# Patient Record
Sex: Male | Born: 1980 | Race: White | Hispanic: No | Marital: Married | Smoking: Never smoker
Health system: Southern US, Community
[De-identification: ages and names within clinical notes are randomized; demographics above are authoritative.]

## PROBLEM LIST (undated history)

## (undated) DIAGNOSIS — E119 Type 2 diabetes mellitus without complications: Secondary | ICD-10-CM

## (undated) DIAGNOSIS — I10 Essential (primary) hypertension: Secondary | ICD-10-CM

## (undated) DIAGNOSIS — N200 Calculus of kidney: Secondary | ICD-10-CM

## (undated) DIAGNOSIS — U071 COVID-19: Secondary | ICD-10-CM

## (undated) DIAGNOSIS — N419 Inflammatory disease of prostate, unspecified: Secondary | ICD-10-CM

## (undated) DIAGNOSIS — E785 Hyperlipidemia, unspecified: Secondary | ICD-10-CM

## (undated) DIAGNOSIS — T7840XA Allergy, unspecified, initial encounter: Secondary | ICD-10-CM

## (undated) HISTORY — DX: Essential (primary) hypertension: I10

## (undated) HISTORY — DX: COVID-19: U07.1

## (undated) HISTORY — DX: Calculus of kidney: N20.0

## (undated) HISTORY — DX: Allergy, unspecified, initial encounter: T78.40XA

## (undated) HISTORY — PX: EYE SURGERY: SHX253

## (undated) HISTORY — PX: FRACTURE SURGERY: SHX138

## (undated) HISTORY — DX: Inflammatory disease of prostate, unspecified: N41.9

## (undated) HISTORY — PX: LASIK: SHX215

## (undated) HISTORY — PX: FEMUR FRACTURE SURGERY: SHX633

---

## 2008-05-12 DIAGNOSIS — N419 Inflammatory disease of prostate, unspecified: Secondary | ICD-10-CM

## 2008-05-12 HISTORY — DX: Inflammatory disease of prostate, unspecified: N41.9

## 2010-12-04 ENCOUNTER — Encounter: Payer: Self-pay | Admitting: *Deleted

## 2010-12-04 ENCOUNTER — Emergency Department (HOSPITAL_BASED_OUTPATIENT_CLINIC_OR_DEPARTMENT_OTHER)
Admission: EM | Admit: 2010-12-04 | Discharge: 2010-12-04 | Disposition: A | Discharge status: Home / Self Care | Payer: Managed Care, Other (non HMO) | Attending: Emergency Medicine | Admitting: Emergency Medicine

## 2010-12-04 ENCOUNTER — Emergency Department (INDEPENDENT_AMBULATORY_CARE_PROVIDER_SITE_OTHER): Payer: Managed Care, Other (non HMO)

## 2010-12-04 DIAGNOSIS — R5381 Other malaise: Secondary | ICD-10-CM

## 2010-12-04 DIAGNOSIS — IMO0001 Reserved for inherently not codable concepts without codable children: Secondary | ICD-10-CM | POA: Insufficient documentation

## 2010-12-04 DIAGNOSIS — E785 Hyperlipidemia, unspecified: Secondary | ICD-10-CM | POA: Insufficient documentation

## 2010-12-04 DIAGNOSIS — B2790 Infectious mononucleosis, unspecified without complication: Secondary | ICD-10-CM | POA: Insufficient documentation

## 2010-12-04 DIAGNOSIS — R7989 Other specified abnormal findings of blood chemistry: Secondary | ICD-10-CM

## 2010-12-04 DIAGNOSIS — R509 Fever, unspecified: Secondary | ICD-10-CM

## 2010-12-04 DIAGNOSIS — R5383 Other fatigue: Secondary | ICD-10-CM

## 2010-12-04 HISTORY — DX: Hyperlipidemia, unspecified: E78.5

## 2010-12-04 LAB — URINALYSIS, ROUTINE W REFLEX MICROSCOPIC
Hgb urine dipstick: NEGATIVE
Leukocytes, UA: NEGATIVE
Nitrite: NEGATIVE
Protein, ur: 30 mg/dL — AB
Specific Gravity, Urine: 1.037 — ABNORMAL HIGH (ref 1.005–1.030)
Urobilinogen, UA: 1 mg/dL (ref 0.0–1.0)

## 2010-12-04 LAB — CBC
MCV: 77.3 fL — ABNORMAL LOW (ref 78.0–100.0)
Platelets: 154 10*3/uL (ref 150–400)
RBC: 5.1 MIL/uL (ref 4.22–5.81)
RDW: 15.8 % — ABNORMAL HIGH (ref 11.5–15.5)
WBC: 6.1 10*3/uL (ref 4.0–10.5)

## 2010-12-04 LAB — DIFFERENTIAL
Basophils Absolute: 0 10*3/uL (ref 0.0–0.1)
Eosinophils Relative: 1 % (ref 0–5)
Lymphocytes Relative: 74 % — ABNORMAL HIGH (ref 12–46)
Lymphs Abs: 4.5 10*3/uL — ABNORMAL HIGH (ref 0.7–4.0)
Monocytes Relative: 13 % — ABNORMAL HIGH (ref 3–12)
Neutrophils Relative %: 12 % — ABNORMAL LOW (ref 43–77)

## 2010-12-04 LAB — COMPREHENSIVE METABOLIC PANEL
ALT: 130 U/L — ABNORMAL HIGH (ref 0–53)
Albumin: 3.4 g/dL — ABNORMAL LOW (ref 3.5–5.2)
Alkaline Phosphatase: 139 U/L — ABNORMAL HIGH (ref 39–117)
Calcium: 9.1 mg/dL (ref 8.4–10.5)
GFR calc Af Amer: >60 mL/min (ref >60)
Glucose, Bld: 127 mg/dL — ABNORMAL HIGH (ref 70–99)
Potassium: 4 mEq/L (ref 3.5–5.1)
Sodium: 137 mEq/L (ref 135–145)
Total Protein: 7.7 g/dL (ref 6.0–8.3)

## 2010-12-04 LAB — RAPID STREP SCREEN (MED CTR MEBANE ONLY): Streptococcus, Group A Screen (Direct): NEGATIVE

## 2010-12-04 LAB — MONONUCLEOSIS SCREEN: Mono Screen: POSITIVE — AB

## 2010-12-04 MED ORDER — PREDNISONE 10 MG PO TABS: 20.0000 mg | ORAL_TABLET | Freq: Every day | ORAL | Status: AC

## 2010-12-04 NOTE — ED Notes (Signed)
Pt in with c/o sore throat and fever x 1.5 weeks. Reports recent treatment from [facility] with possible dx of [facility] Spotted Fever. Pt recently told no RMSF.

## 2010-12-04 NOTE — ED Notes (Signed)
Pt in with c/o generalized aches, chills, decreased appetite and po intake x 1.5 weeks. Recently seen for possible rocky [facility] spotted fever but testing came back neg. Pt reports to ED under advice from PCP for advanced blood work. Pt afebrile at present time but reports fever x 1 week and treating fever with acetaminophen. Pt was told by PCP office that he may have viral syndrome. Reports slight improvement since taking antibiotics, but still with generalized aches, chills and cold s/sx. Airway patent and intact.

## 2010-12-04 NOTE — ED Provider Notes (Signed)
History     Chief Complaint  Patient presents with  . Fever   HPI Comments: Pt states that he was seen over a week ago and treat for possible rmsf which resulted negative since,pt states that he has 2 days left of the doxy:pt states that he also had a low plt count with his labs:pt states that he has continued to have fever with the antibiotics  Patient is a 30 y.o. male presenting with fever. The history is provided by the patient. No language interpreter was used.  Fever Primary symptoms of the febrile illness include fever and myalgias. Primary symptoms do not include fatigue, visual change, headaches, cough, shortness of breath, abdominal pain, nausea, diarrhea, dysuria or rash. The current episode started more than 1 week ago. This is a new problem. The problem has not changed since onset. The fever began more than 1 week ago. The maximum temperature recorded prior to his arrival was 103 to 104 F. The temperature was taken by an oral thermometer.    Past Medical History  Diagnosis Date  . Hyperlipemia     Past Surgical History  Procedure Date  . Femur fracture surgery   . Lasix      History reviewed. No pertinent family history.  History  Substance Use Topics  . Smoking status: Never Smoker   . Smokeless tobacco: Never Used  . Alcohol Use: Yes      Review of Systems  Constitutional: Positive for fever. Negative for fatigue.  HENT: Negative for neck pain and neck stiffness.        Sorethroat  Respiratory: Negative for cough and shortness of breath.   Gastrointestinal: Negative for nausea, abdominal pain and diarrhea.  Genitourinary: Negative for dysuria.  Musculoskeletal: Positive for myalgias.  Skin: Negative for rash.  Neurological: Negative for headaches.  All other systems reviewed and are negative.    Physical Exam  BP 165/96  Pulse 80  Temp(Src) 98.2 F (36.8 C) (Oral)  Resp 20  Wt 239 lb (108.41 kg)  SpO2 100%  Physical Exam  Constitutional: He  is oriented to person, place, and time. He appears well-developed and well-nourished.  HENT:  Head: Normocephalic and atraumatic.  Right Ear: Tympanic membrane normal.  Left Ear: Tympanic membrane normal.  Mouth/Throat: Oropharyngeal exudate and posterior oropharyngeal edema present.  Eyes: Pupils are equal, round, and reactive to light.  Neck: Normal range of motion. Neck supple.  Cardiovascular: Normal rate.   Pulmonary/Chest: Effort normal and breath sounds normal.  Abdominal: Soft. Bowel sounds are normal.  Musculoskeletal: Normal range of motion.  Neurological: He is oriented to person, place, and time.  Skin: Skin is warm and dry.  Psychiatric: He has a normal mood and affect. His behavior is normal.    ED Course  Procedures  Labs Reviewed  CBC - Abnormal; Notable for the following:    MCV 77.3 (*)    RDW 15.8 (*)    All other components within normal limits  DIFFERENTIAL - Abnormal; Notable for the following:    Neutrophils Relative 12 (*)    Lymphocytes Relative 74 (*)    Monocytes Relative 13 (*)    Neutro Abs 0.7 (*)    Lymphs Abs 4.5 (*)    All other components within normal limits  URINALYSIS, ROUTINE W REFLEX MICROSCOPIC - Abnormal; Notable for the following:    Color, Urine AMBER (*) BIOCHEMICALS MAY BE AFFECTED BY COLOR   Specific Gravity, Urine 1.037 (*)    Bilirubin Urine SMALL (*)  Protein, ur 30 (*)    All other components within normal limits  COMPREHENSIVE METABOLIC PANEL - Abnormal; Notable for the following:    Glucose, Bld 127 (*)    Albumin 3.4 (*)    AST 63 (*)    ALT 130 (*)    Alkaline Phosphatase 139 (*)    All other components within normal limits  MONONUCLEOSIS SCREEN - Abnormal; Notable for the following:    Mono Screen POSITIVE (*)    All other components within normal limits  RAPID STREP SCREEN  URINE MICROSCOPIC-ADD ON  PATHOLOGIST SMEAR REVIEW   MDM Pt positive for mono:will give steriods to QM:VHQIONGEX with pt that lfts need  to be redrawn:pt educated on contact sports      Teressa Lower, NP 12/04/10 1757

## 2010-12-05 ENCOUNTER — Ambulatory Visit (INDEPENDENT_AMBULATORY_CARE_PROVIDER_SITE_OTHER): Payer: Managed Care, Other (non HMO) | Admitting: Internal Medicine

## 2010-12-05 ENCOUNTER — Encounter: Payer: Self-pay | Admitting: Internal Medicine

## 2010-12-05 DIAGNOSIS — Z0000 Encounter for general adult medical examination without abnormal findings: Secondary | ICD-10-CM

## 2010-12-05 DIAGNOSIS — R7309 Other abnormal glucose: Secondary | ICD-10-CM

## 2010-12-05 DIAGNOSIS — B178 Other specified acute viral hepatitis: Secondary | ICD-10-CM

## 2010-12-05 DIAGNOSIS — B199 Unspecified viral hepatitis without hepatic coma: Secondary | ICD-10-CM

## 2010-12-05 DIAGNOSIS — B2790 Infectious mononucleosis, unspecified without complication: Secondary | ICD-10-CM

## 2010-12-05 DIAGNOSIS — R739 Hyperglycemia, unspecified: Secondary | ICD-10-CM

## 2010-12-05 LAB — PATHOLOGIST SMEAR REVIEW: Plt Morphology: REACTIVE

## 2010-12-05 NOTE — Progress Notes (Signed)
  Subjective:    Patient ID: James Bautista, male    DOB: 01-09-1981, 30 y.o.   MRN: [id number]  HPI James Bautista presents acutely as a new patient for a febrile illness that was diagnosed 7/25 as EBV/mononucleosis with lyphocytosis, fever, elevated transaminases and malaise. He was started on steroids at the ED and is instructed to stop this medication. He continues to feel bad but has not had any new symptoms. ED evaluation was otherwise unremarkable. Aside from this acute illness he has been doing well.    Past Medical History  Diagnosis Date  . Hyperlipemia   . Allergy     seasonal  . Kidney stone     never passed a stone or had radiographic evidence of stone.   . Prostatitis 2010    had to wear catheter for urinary retention. [facility] urologist.    Past Surgical History  Procedure Date  . Femur fracture surgery '92    right leg.  . Lasix     Family History  Problem Relation Age of Onset  . Diabetes Father   . Hyperlipidemia Father   . Goiter Sister   . Cancer Paternal Aunt     ovarian cancer  . Cancer Maternal Grandmother     breast  . Heart disease Maternal Grandfather    History   Social History  . Marital Status: Single    Spouse Name: N/A    Number of Children: 0  . Years of Education: 21   Occupational History  . lawyer    Social History Main Topics  . Smoking status: Never Smoker   . Smokeless tobacco: Never Used  . Alcohol Use: 0.5 oz/week    1 drink(s) per week  . Drug Use: No  . Sexually Active: Yes -- Male partner(s)   Other Topics Concern  . Not on file   Social History Narrative   [place] - BA, [employer] 2010. Single. Uses condoms. 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       Review of Systems Review of Systems  Constitutional:  Positive for fever, chills, activity change.  HEENT:  Negative for hearing loss, ear pain, congestion, neck stiffness and postnasal drip. Positive for sore throat. Negative for dental  complaints.   Eyes: Negative for vision loss or change in visual acuity.  Respiratory: Negative for chest tightness and wheezing.   Cardiovascular: Negative for chest pain and palpitation. No decreased exercise tolerance Gastrointestinal: No change in bowel habit. No bloating or gas. No reflux or indigestion Genitourinary: Negative for urgency, frequency, flank pain and difficulty urinating.  Musculoskeletal: Positive for myalgias,arthralgia.  Neurological: Negative for dizziness, tremors, weakness and headaches.  Hematological: Negative for adenopathy.  Psychiatric/Behavioral: Negative for behavioral problems and dysphoric mood.       Objective:   Physical Exam Vitals noted. Gen'l - WND white male who is mildly diaphoretic HEENT C&S clear, no icterus Pulmonary - no increased work of breathing Cor - RRR Neuor - awake and alert, oriented x 3. CN II-XII normal       Assessment & Plan:

## 2010-12-06 DIAGNOSIS — B178 Other specified acute viral hepatitis: Secondary | ICD-10-CM | POA: Insufficient documentation

## 2010-12-06 DIAGNOSIS — E119 Type 2 diabetes mellitus without complications: Secondary | ICD-10-CM | POA: Insufficient documentation

## 2010-12-06 DIAGNOSIS — Z0000 Encounter for general adult medical examination without abnormal findings: Secondary | ICD-10-CM | POA: Insufficient documentation

## 2010-12-06 NOTE — Assessment & Plan Note (Signed)
Patient with a febrile illness with a positive monspot, lymphocytosis, generalized malaise and fatigue, mild elevation in transaminases. He did have a complete exam at the ED 7/25 - with no note remarking on hepto-splenomegaly. Although afebrile at today's visit he is diaphoretic and reports that he still has chills and feels febrile.  Plan - continue acetaminophen 1000 mg three times a day on schedule until symptoms have resolved. May supplement with NSAID of choice for pain or myalgias.           Reduced activity, including out of work, until afebrile.           No contact activities due to risk of splenomegaly           Follow up lab work in 6 weeks: monitor LFTs

## 2010-12-06 NOTE — Assessment & Plan Note (Signed)
Non-fasting serum glucose of 127 in the ED 7/25. He had not been eating but had consume gatoraide. He has a strong family history of diabetes.  Plan - no medical intervention           A1C at next lab draw           Recommended a limited sugar/carbohydrate diet.           Recommended a regular exercise regimen

## 2010-12-06 NOTE — Assessment & Plan Note (Signed)
No chronic medical problems. He had full exam and labs with his ED evaluation and there is no need to repeat. Will add lipid panel to follow-up lab orders in 6 weeks. He is advised to begin investing now in regular exercise and a prudent diet. He believes his tetanus immunization is up to date.

## 2010-12-07 NOTE — Progress Notes (Signed)
Mailed/SD 

## 2010-12-09 NOTE — ED Provider Notes (Signed)
History     Chief Complaint  Patient presents with  . Fever   HPI  Past Medical History  Diagnosis Date  . Hyperlipemia   . Allergy     seasonal  . Kidney stone     never passed a stone or had radiographic evidence of stone.   . Prostatitis 2010    had to wear catheter for urinary retention. [facility] urologist.     Past Surgical History  Procedure Date  . Femur fracture surgery '92    right leg.  . Lasix      Family History  Problem Relation Age of Onset  . Diabetes Father   . Hyperlipidemia Father   . Goiter Sister   . Cancer Paternal Aunt     ovarian cancer  . Cancer Maternal Grandmother     breast  . Heart disease Maternal Grandfather     History  Substance Use Topics  . Smoking status: Never Smoker   . Smokeless tobacco: Never Used  . Alcohol Use: 0.5 oz/week    1 drink(s) per week      Review of Systems  Physical Exam  BP 133/68  Pulse 100  Temp(Src) 99.2 F (37.3 C) (Oral)  Resp 18  Wt 239 lb (108.41 kg)  SpO2 97%  Physical Exam  ED Course  Procedures  MDM Looks okay.  Will discharge.        Geoffery Lyons, MD 12/22/10 1550

## 2010-12-09 NOTE — ED Provider Notes (Signed)
Medical screening examination/treatment/procedure(s) were performed by non-physician practitioner and as supervising physician I was immediately available for consultation/collaboration.   Geoffery Lyons, MD 12/09/10 [phone]

## 2012-10-11 ENCOUNTER — Encounter: Payer: Self-pay | Admitting: Internal Medicine

## 2012-10-11 ENCOUNTER — Ambulatory Visit (INDEPENDENT_AMBULATORY_CARE_PROVIDER_SITE_OTHER): Payer: Managed Care, Other (non HMO) | Admitting: Internal Medicine

## 2012-10-11 VITALS — BP 128/88 | HR 79 | Temp 98.3°F | Wt 228.8 lb

## 2012-10-11 DIAGNOSIS — R7309 Other abnormal glucose: Secondary | ICD-10-CM

## 2012-10-11 DIAGNOSIS — R739 Hyperglycemia, unspecified: Secondary | ICD-10-CM

## 2012-10-11 DIAGNOSIS — Z0000 Encounter for general adult medical examination without abnormal findings: Secondary | ICD-10-CM

## 2012-10-11 NOTE — Assessment & Plan Note (Signed)
1. Diabetes - you give a history of symptoms that is indeed strongly suggestive of elevated blood. There is a history of hyperglycemia in '12 and a family history of diabetes.  Plan Life-style: no sugar, complex carbohydrates in small reasonable portions  Lab: A1C the gold standard that measure what the blood sugar has been on average for the past 90 days.          Routine lab: cholesterol panel, kidney function and electrolytes  Return for general exam along with dealing with skin issues - ask for a double appointment (60 min)

## 2012-10-11 NOTE — Progress Notes (Signed)
  Subjective:    Patient ID: James Bautista, male    DOB: 02-16-1981, 32 y.o.   MRN: [id number]  HPI Mr. Austad presents with a report of increased thirst, dry mouth, abdominal pain, change in vision - blurred. He had increased symptoms after having a day at amusement park with increase glucose and carb intake. He did have hyperglycemia to 127 in '12 and has a positive family history.  Past Medical History  Diagnosis Date  . Hyperlipemia   . Allergy     seasonal  . Kidney stone     never passed a stone or had radiographic evidence of stone.   . Prostatitis 2010    had to wear catheter for urinary retention. [facility]rologist.    Past Surgical History  Procedure Laterality Date  . Femur fracture surgery  '92    right leg.  . Lasix      Family History  Problem Relation Age of Onset  . Diabetes Father   . Hyperlipidemia Father   . Goiter Sister   . Cancer Paternal Aunt     ovarian cancer  . Cancer Maternal Grandmother     breast  . Heart disease Maternal Grandfather    History   Social History  . Marital Status: Single    Spouse Name: N/A    Number of Children: 0  . Years of Education: 69   Occupational History  . lawyer    Social History Main Topics  . Smoking status: Never Smoker   . Smokeless tobacco: Never Used  . Alcohol Use: No  . Drug Use: No  . Sexually Active: Yes -- Male partner(s)   Other Topics Concern  . Not on file   Social History Narrative   [place] - BA, [employer] 2010. Married. Uses condoms. 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    No current outpatient prescriptions on file prior to visit.   No current facility-administered medications on file prior to visit.      Review of Systems System review is negative for any constitutional, cardiac, pulmonary, GI or neuro symptoms or complaints other than as described in the HPI.     Objective:   Physical Exam Filed Vitals:   10/11/12 1704  BP: 128/88  Pulse: 79   Temp: 98.3 F (36.8 C)   Gen'l - WNWD white man in no distress RRR Pulm - normal respirations Neuro - A&O x 3.       Assessment & Plan:

## 2012-10-11 NOTE — Patient Instructions (Addendum)
1. Diabetes - you give a history of symptoms that is indeed strongly suggestive of elevated blood. There is a history of hyperglycemia in '12 and a family history of diabetes.  Plan Life-style: no sugar, complex carbohydrates in small reasonable portions  Lab: A1C the gold standard that measure what the blood sugar has been on average for the past 90 days.          Routine lab: cholesterol panel, kidney function and electrolytes  Return for general exam along with dealing with skin issues - ask for a double appointment (60 min)  Sign up for MyChart

## 2012-10-13 ENCOUNTER — Other Ambulatory Visit (INDEPENDENT_AMBULATORY_CARE_PROVIDER_SITE_OTHER): Payer: Managed Care, Other (non HMO)

## 2012-10-13 DIAGNOSIS — R739 Hyperglycemia, unspecified: Secondary | ICD-10-CM

## 2012-10-13 DIAGNOSIS — Z0000 Encounter for general adult medical examination without abnormal findings: Secondary | ICD-10-CM

## 2012-10-13 DIAGNOSIS — R7309 Other abnormal glucose: Secondary | ICD-10-CM

## 2012-10-13 LAB — COMPREHENSIVE METABOLIC PANEL
ALT: 71 U/L — ABNORMAL HIGH (ref 0–53)
CO2: 27 mEq/L (ref 19–32)
Calcium: 9 mg/dL (ref 8.4–10.5)
Chloride: 103 mEq/L (ref 96–112)
GFR: 109.85 mL/min (ref >60.00)
Glucose, Bld: 254 mg/dL — ABNORMAL HIGH (ref 70–99)
Sodium: 138 mEq/L (ref 135–145)
Total Protein: 7 g/dL (ref 6.0–8.3)

## 2012-10-13 LAB — LIPID PANEL
Cholesterol: 124 mg/dL (ref 0–200)
HDL: 29 mg/dL — ABNORMAL LOW (ref >39.00)

## 2012-10-13 LAB — LDL CHOLESTEROL, DIRECT: Direct LDL: 67.2 mg/dL

## 2012-10-13 LAB — HEMOGLOBIN A1C: Hgb A1c MFr Bld: 11.2 % — ABNORMAL HIGH (ref 4.6–6.5)

## 2012-10-14 MED ORDER — METFORMIN HCL 500 MG PO TABS: 500.0000 mg | ORAL_TABLET | Freq: Two times a day (BID) | ORAL | Status: DC

## 2012-10-20 ENCOUNTER — Encounter: Payer: Self-pay | Admitting: Internal Medicine

## 2012-11-01 ENCOUNTER — Telehealth: Payer: Self-pay | Admitting: Internal Medicine

## 2012-11-01 NOTE — Telephone Encounter (Signed)
Pt's appt has been changed to 60 min

## 2012-11-01 NOTE — Progress Notes (Unsigned)
Apt changed to 60 min.

## 2012-11-01 NOTE — Telephone Encounter (Signed)
Patient's apt has been changed to 60 min due to the request of Dr.Norins

## 2012-12-09 ENCOUNTER — Encounter: Payer: Self-pay | Admitting: Internal Medicine

## 2012-12-09 ENCOUNTER — Other Ambulatory Visit: Payer: Self-pay | Admitting: Internal Medicine

## 2012-12-09 DIAGNOSIS — R739 Hyperglycemia, unspecified: Secondary | ICD-10-CM

## 2012-12-13 ENCOUNTER — Other Ambulatory Visit (INDEPENDENT_AMBULATORY_CARE_PROVIDER_SITE_OTHER): Payer: Managed Care, Other (non HMO)

## 2012-12-13 DIAGNOSIS — R739 Hyperglycemia, unspecified: Secondary | ICD-10-CM

## 2012-12-13 DIAGNOSIS — R7309 Other abnormal glucose: Secondary | ICD-10-CM

## 2012-12-16 ENCOUNTER — Encounter: Payer: Self-pay | Admitting: Internal Medicine

## 2012-12-16 ENCOUNTER — Ambulatory Visit (INDEPENDENT_AMBULATORY_CARE_PROVIDER_SITE_OTHER): Payer: Managed Care, Other (non HMO) | Admitting: Internal Medicine

## 2012-12-16 VITALS — BP 126/80 | HR 89 | Temp 89.0°F | Ht 72.0 in | Wt 225.4 lb

## 2012-12-16 DIAGNOSIS — Z0000 Encounter for general adult medical examination without abnormal findings: Secondary | ICD-10-CM

## 2012-12-16 DIAGNOSIS — E119 Type 2 diabetes mellitus without complications: Secondary | ICD-10-CM

## 2012-12-16 MED ORDER — METFORMIN HCL 500 MG PO TABS: 500.0000 mg | ORAL_TABLET | Freq: Two times a day (BID) | ORAL | Status: DC

## 2012-12-16 NOTE — Progress Notes (Signed)
Subjective:     Patient ID: James Bautista, male   DOB: May 16, 1980, 32 y.o.   MRN: [id number]  HPI Comments: Mr. Welton is a 32 year-old here for a well-check physical and to discuss his A1C levels.  He has no complaints at this time and has tolerated his metformin well, which he has been taking since June.  He does note a constantly stuffy nose, which he attributes to seasonal allergies.  He also came in to have some skin tags removed.    Review of Systems  Constitutional: Negative for fever, chills, diaphoresis and fatigue.  Eyes: Negative for photophobia, pain, redness, itching and visual disturbance.  Respiratory: Negative for cough, choking, chest tightness and shortness of breath.   Cardiovascular: Negative for chest pain.  Gastrointestinal: Negative for nausea, abdominal pain, diarrhea, constipation and abdominal distention.       Since starting metformin, he notes that he has had more loose stools.  Endocrine: Negative for heat intolerance.  Neurological: Negative for light-headedness and numbness.   No changes in vision      Objective:   Physical Exam  Constitutional: He appears well-developed and well-nourished. No distress.  HENT:  Head: Normocephalic and atraumatic.  Right Ear: External ear normal.  Nose: Nose normal.  Mouth/Throat: Oropharynx is clear and moist. No oropharyngeal exudate.  Eyes: Conjunctivae and EOM are normal. Pupils are equal, round, and reactive to light. Right eye exhibits no discharge. Left eye exhibits no discharge.  Neck: Normal range of motion. Neck supple. No JVD present. No tracheal deviation present. No thyromegaly present.  Cardiovascular: Normal rate, regular rhythm, normal heart sounds and intact distal pulses.  Exam reveals no gallop and no friction rub.   No murmur heard. Pulmonary/Chest: Effort normal and breath sounds normal. No stridor. No respiratory distress. He has no wheezes. He has no rales.  Abdominal: Soft. Bowel sounds are  normal. He exhibits no distension and no mass. There is no tenderness. There is no rebound and no guarding.  Musculoskeletal: He exhibits no edema.  Skin: He is not diaphoretic.   Procedure: skin tag(s) removal secondary to chronic irritation and subsequent inflammation. Location(s): right neck x 2, left neck x 1 Indication: Consent: after patient is informed of the risks of procedure including bleeding, infection, scarring a written informed consent is signed Anesthesia:  Topical freeze Prep: betadiene Excision:  Double point skin scissors Wound care: none needed After care: routine cleaning with soap and water. No bandage required Call back for fever, bleeding, purulent drainage, increasing erythema or pain.   Recent Results (from the past 2160 hour(s))  LIPID PANEL     Status: Abnormal   Collection Time    10/13/12  9:04 AM      Result Value Range   Cholesterol 124  0 - 200 mg/dL   Comment: ATP III Classification       Desirable:  < 200 mg/dL               Borderline High:  200 - 239 mg/dL          High:  > = 956 mg/dL   Triglycerides 213.0 (*) 0.0 - 149.0 mg/dL   Comment: Normal:  <865 mg/dLBorderline High:  150 - 199 mg/dL   HDL 78.46 (*) >96.29 mg/dL   VLDL 52.8 (*) 0.0 - 41.3 mg/dL   Total CHOL/HDL Ratio 4     Comment:                Men  Women1/2 Average Risk     3.4          3.3Average Risk          5.0          4.42X Average Risk          9.6          7.13X Average Risk          15.0          11.0                      TSH     Status: None   Collection Time    10/13/12  9:04 AM      Result Value Range   TSH 1.48  0.35 - 5.50 uIU/mL  HEMOGLOBIN A1C     Status: Abnormal   Collection Time    10/13/12  9:04 AM      Result Value Range   Hemoglobin A1C 11.2 (*) 4.6 - 6.5 %   Comment: Glycemic Control Guidelines for People with Diabetes:Non Diabetic:  <6%Goal of Therapy: <7%Additional Action Suggested:  >8%   COMPREHENSIVE METABOLIC PANEL     Status: Abnormal    Collection Time    10/13/12  9:04 AM      Result Value Range   Sodium 138  135 - 145 mEq/L   Potassium 4.0  3.5 - 5.1 mEq/L   Chloride 103  96 - 112 mEq/L   CO2 27  19 - 32 mEq/L   Glucose, Bld 254 (*) 70 - 99 mg/dL   BUN 19  6 - 23 mg/dL   Creatinine, Ser 0.9  0.4 - 1.5 mg/dL   Total Bilirubin 0.8  0.3 - 1.2 mg/dL   Alkaline Phosphatase 101  39 - 117 U/L   AST 38 (*) 0 - 37 U/L   ALT 71 (*) 0 - 53 U/L   Total Protein 7.0  6.0 - 8.3 g/dL   Albumin 3.9  3.5 - 5.2 g/dL   Calcium 9.0  8.4 - 11.9 mg/dL   GFR 147.82  >95.62 mL/min  LDL CHOLESTEROL, DIRECT     Status: None   Collection Time    10/13/12  9:04 AM      Result Value Range   Direct LDL 67.2     Comment: Optimal:  <100 mg/dLNear or Above Optimal:  100-129 mg/dLBorderline High:  130-159 mg/dLHigh:  160-189 mg/dLVery High:  >190 mg/dL  HEMOGLOBIN Z3Y     Status: Abnormal   Collection Time    12/13/12  8:38 AM      Result Value Range   Hemoglobin A1C 7.0 (*) 4.6 - 6.5 %   Comment: Glycemic Control Guidelines for People with Diabetes:Non Diabetic:  <6%Goal of Therapy: <7%Additional Action Suggested:  >8%        Assessment and Plan:     Diabetes: Mr. Michelsen A1C is at goal.  He was advised to continue to eat a healthy diet and to exercise regularly.  He will continue taking metformin as recommended.  Skin tags: these are to be removed   Health maintance: Mr. Wecker is most likely due for a Tetanus or TDaP vaccine.  His A1C is up to date (last checked 12/13/12, due 03/15/2013), as is his lipid profile (last checked 10/13/12, due 10/13/16).

## 2012-12-17 NOTE — Assessment & Plan Note (Signed)
Interval history - he has done a great job of controlling newly diagnosed DM. Physical Exam is normal. Labs are great. Immunization brought up to date.  In summary   a very nice young chap who has taken charge of his health and is doing an EXCELLENT job of managing his diabetes. Return for lab in 3 months

## 2012-12-17 NOTE — Assessment & Plan Note (Signed)
  Lab Results  Component Value Date   HGBA1C 7.0* 12/13/2012   Mr. Eddings's A1C is at goal. He has lost 20 lbs by change in diet. He is exercising    Plan He was advised to continue to eat a healthy diet and to exercise regularly.  He will continue taking metformin as recommended.   Recheck A1C in 3 months, if continued control and if A1C is 6.5% or less will stop medication.

## 2013-02-09 ENCOUNTER — Ambulatory Visit (INDEPENDENT_AMBULATORY_CARE_PROVIDER_SITE_OTHER): Payer: Managed Care, Other (non HMO) | Admitting: Internal Medicine

## 2013-02-09 ENCOUNTER — Encounter: Payer: Self-pay | Admitting: Internal Medicine

## 2013-02-09 VITALS — BP 118/90 | HR 104 | Wt 222.0 lb

## 2013-02-09 DIAGNOSIS — E669 Obesity, unspecified: Secondary | ICD-10-CM

## 2013-02-09 DIAGNOSIS — B86 Scabies: Secondary | ICD-10-CM | POA: Insufficient documentation

## 2013-02-09 DIAGNOSIS — E119 Type 2 diabetes mellitus without complications: Secondary | ICD-10-CM

## 2013-02-09 MED ORDER — PERMETHRIN 5 % EX CREA: TOPICAL_CREAM | CUTANEOUS | Status: DC

## 2013-02-09 NOTE — Assessment & Plan Note (Addendum)
Continue metoformin 500 mg BID.

## 2013-02-09 NOTE — Patient Instructions (Addendum)
Looks like it may be scabies.  Plan Permethrin 5% cream apply to all lesions daily x 7 days  MyChart msg Monday as to response: if not clearing will refer to dermatology  Scabies Scabies are small bugs (mites) that burrow under the skin and cause red bumps and severe itching. These bugs can only be seen with a microscope. Scabies are highly contagious. They can spread easily from person to person by direct contact. They are also spread through sharing clothing or linens that have the scabies mites living in them. It is not unusual for an entire family to become infected through shared towels, clothing, or bedding.  HOME CARE INSTRUCTIONS   Your caregiver may prescribe a cream or lotion to kill the mites. If cream is prescribed, massage the cream into the entire body from the neck to the bottom of both feet. Also massage the cream into the scalp and face if your child is less than 11 year old. Avoid the eyes and mouth. Do not wash your hands after application.  Leave the cream on for 8 to 12 hours. Your child should bathe or shower after the 8 to 12 hour application period. Sometimes it is helpful to apply the cream to your child right before bedtime.  One treatment is usually effective and will eliminate approximately 95% of infestations. For severe cases, your caregiver may decide to repeat the treatment in 1 week. Everyone in your household should be treated with one application of the cream.  New rashes or burrows should not appear within 24 to 48 hours after successful treatment. However, the itching and rash may last for 2 to 4 weeks after successful treatment. Your caregiver may prescribe a medicine to help with the itching or to help the rash go away more quickly.  Scabies can live on clothing or linens for up to 3 days. All of your child's recently used clothing, towels, stuffed toys, and bed linens should be washed in hot water and then dried in a dryer for at least 20 minutes on high heat.  Items that cannot be washed should be enclosed in a plastic bag for at least 3 days.  To help relieve itching, bathe your child in a cool bath or apply cool washcloths to the affected areas.  Your child may return to school after treatment with the prescribed cream. SEEK MEDICAL CARE IF:   The itching persists longer than 4 weeks after treatment.  The rash spreads or becomes infected. Signs of infection include red blisters or yellow-tan crust. Document Released: 04/28/2005 Document Revised: 07/21/2011 Document Reviewed: 09/06/2008 [facility] Patient Information 2014 [place], [place].

## 2013-02-09 NOTE — Progress Notes (Signed)
Subjective:     Patient ID: James Bautista, male   DOB: 01/14/1981, 32 y.o.   MRN: [id number]  HPI James Bautista is a 32 year old male with a history of recently diagnosed diabetes who presents with clustered, small, erythematous papules scattered over his chest, upper arms, groin, and ankles. Some of the papules are excoriated with hemorrhagic crusts. The papules have arisen over the past week and have been progressively appearing. Some of the papules seem to be healing and itch about 1/10, but others itch 9/10. James Bautista has taken some oral benadryl and used some topical benadryl cream, which relieves the itching some. He has no history of camping or recent contacts with similar symptoms. He reports having spent some time around children at a nursery at church before the symptoms started. He lives at home with his wife who has no similar symptoms. He has a history of allergy to some detergents, but has not recently changed detergents. He did recently change his bar of soap, but it is a brand he has used before with no problems.   PMH, FamHx and SocHx reviewed for any changes and relevance.  Current Outpatient Prescriptions on File Prior to Visit  Medication Sig Dispense Refill  . metFORMIN (GLUCOPHAGE) 500 MG tablet Take 1 tablet (500 mg total) by mouth 2 (two) times daily with a meal.  60 tablet  11   No current facility-administered medications on file prior to visit.     Review of Systems --General: No fever, chills or sweats. -- CV: No chest pain.  --GI: Occasional loose stool since starting Metformin, improving in frequency over past few months. No nausea, vomiting, abdominal pain, or constipation.  -- MSK: Occasional positional numbness/tingling. Occasional muscle pains in right bicep and thigh over past few weeks. Denies any other myalgias or arthralgias.  --Endocrine: Denies any polyuria, polydipsia.  -- Neurologic: Denies any vision changes or HA.     Objective:   Physical  Exam General: Overweight male sitting in no acute distress.  Skin: Scattered small erythematous papules with excoriations and some with hemorrhagic crusts. Scattered in small clusters over flexural surfaces of arms, shoulders, chest, groin, and ankles. No other skin changes noted.  CV: Regular rate and rhythm, no murmurs or rubs.  Pulm: Clear to auscultation bilaterally, no wheezes or crackles.     Assessment:     James Bautista is a 32 year old male with a history of diabetes who presents with a 1 week history of a progressive pruritic scattered rash over his arms, shoulders, legs and groin. The differential includes scabies vs. Bed bugs vs. Tinea corporis vs. Eczema. Given the distribution and appearance of the rash scabies is most likely. Though there are no burrow lines, the distribution does not include the webs of fingers or wrists, and there are no sick contacts, the appearance of the papules is characteristic of scabies. The lack of symptoms in his wife is less consistent with both scabies and bed bugs. The lesions are not characteristic of tinea given their small size and distribution. Eczema would also be unlikely given the small size of the papules with no background skin erythema and the distribution not including the flexural surfaces of the elbows.     Plan:     See plan by problem list.

## 2013-02-09 NOTE — Assessment & Plan Note (Signed)
Permethrin 5% cream over neck to feet daily for 7 days. Also use with close contacts. If symptoms have not improved within 6 days, call back and will schedule dermatologist appointment.

## 2013-02-14 ENCOUNTER — Encounter: Payer: Self-pay | Admitting: Internal Medicine

## 2013-02-14 ENCOUNTER — Telehealth: Payer: Self-pay

## 2013-02-14 DIAGNOSIS — B86 Scabies: Secondary | ICD-10-CM

## 2013-02-14 NOTE — Telephone Encounter (Signed)
Spoke with pt advised of MDs message 

## 2013-02-14 NOTE — Telephone Encounter (Signed)
Phone call from patient. He states he used the cream all over body. He states there was some improvement. He did have some itching and is not sure if it was new bumps or not. He was to report this to determine if he needs to see a dermatologist. He can be reached at his work # [phone].

## 2013-02-14 NOTE — Telephone Encounter (Signed)
Give a few days. If there are persistent lesions (bumps) will refer to dermatology

## 2013-02-14 NOTE — Telephone Encounter (Signed)
Left message to return call 

## 2013-03-17 ENCOUNTER — Other Ambulatory Visit: Payer: Self-pay

## 2013-12-01 ENCOUNTER — Other Ambulatory Visit: Payer: Self-pay | Admitting: Internal Medicine

## 2013-12-01 DIAGNOSIS — IMO0001 Reserved for inherently not codable concepts without codable children: Secondary | ICD-10-CM

## 2013-12-01 DIAGNOSIS — E785 Hyperlipidemia, unspecified: Secondary | ICD-10-CM

## 2013-12-01 DIAGNOSIS — E1165 Type 2 diabetes mellitus with hyperglycemia: Secondary | ICD-10-CM

## 2013-12-01 MED ORDER — METFORMIN HCL 500 MG PO TABS: 500.0000 mg | ORAL_TABLET | Freq: Two times a day (BID) | ORAL | Status: DC

## 2013-12-01 NOTE — Telephone Encounter (Signed)
Patient is requesting metformin script to be called into his pharmacy at [facility] point until he can establish with Dr. Doug Sou in October.  Please advise.

## 2013-12-01 NOTE — Telephone Encounter (Signed)
Former patient of Dr Linda Hedges appt with Dr Doug Sou 02/28/14 Okay for refill?

## 2013-12-01 NOTE — Telephone Encounter (Signed)
Last A1c 8/14 Last lipids 6/14 # 60 Needs fasting labs : A1c , urine microalbumin, BMET Lipids, hepatic panel, TSH 250.02,272.4

## 2013-12-29 ENCOUNTER — Other Ambulatory Visit (INDEPENDENT_AMBULATORY_CARE_PROVIDER_SITE_OTHER): Payer: Managed Care, Other (non HMO)

## 2013-12-29 DIAGNOSIS — E785 Hyperlipidemia, unspecified: Secondary | ICD-10-CM

## 2013-12-29 DIAGNOSIS — E1165 Type 2 diabetes mellitus with hyperglycemia: Secondary | ICD-10-CM

## 2013-12-29 DIAGNOSIS — IMO0001 Reserved for inherently not codable concepts without codable children: Secondary | ICD-10-CM

## 2013-12-29 LAB — LIPID PANEL
CHOL/HDL RATIO: 5
Cholesterol: 136 mg/dL (ref 0–200)
HDL: 29.9 mg/dL — ABNORMAL LOW (ref >39.00)
LDL Cholesterol: 69 mg/dL (ref 0–99)
NonHDL: 106.1
TRIGLYCERIDES: 186 mg/dL — AB (ref 0.0–149.0)
VLDL: 37.2 mg/dL (ref 0.0–40.0)

## 2013-12-29 LAB — HEMOGLOBIN A1C: HEMOGLOBIN A1C: 5.9 % (ref 4.6–6.5)

## 2013-12-29 LAB — HEPATIC FUNCTION PANEL
ALT: 53 U/L (ref 0–53)
AST: 23 U/L (ref 0–37)
Albumin: 4.2 g/dL (ref 3.5–5.2)
Alkaline Phosphatase: 84 U/L (ref 39–117)
BILIRUBIN DIRECT: 0.1 mg/dL (ref 0.0–0.3)
BILIRUBIN TOTAL: 0.7 mg/dL (ref 0.2–1.2)
Total Protein: 7.7 g/dL (ref 6.0–8.3)

## 2013-12-29 LAB — MICROALBUMIN / CREATININE URINE RATIO
Creatinine,U: 280.8 mg/dL
MICROALB UR: 7.5 mg/dL — AB (ref 0.0–1.9)
MICROALB/CREAT RATIO: 2.7 mg/g (ref 0.0–30.0)

## 2013-12-29 LAB — TSH: TSH: 3.32 u[IU]/mL (ref 0.35–4.50)

## 2013-12-29 LAB — BASIC METABOLIC PANEL
BUN: 15 mg/dL (ref 6–23)
CALCIUM: 9.3 mg/dL (ref 8.4–10.5)
CO2: 30 mEq/L (ref 19–32)
Chloride: 102 mEq/L (ref 96–112)
Creatinine, Ser: 1.1 mg/dL (ref 0.4–1.5)
GFR: 84.9 mL/min (ref >60.00)
GLUCOSE: 97 mg/dL (ref 70–99)
Potassium: 3.9 mEq/L (ref 3.5–5.1)
Sodium: 138 mEq/L (ref 135–145)

## 2013-12-30 ENCOUNTER — Telehealth: Payer: Self-pay | Admitting: Internal Medicine

## 2013-12-30 NOTE — Telephone Encounter (Signed)
Patient states Dr. Linna Darner ordered labs in order to fill metformin.  His pharmacy does not have the refill.  He is checking on the process.

## 2013-12-30 NOTE — Telephone Encounter (Signed)
   His A1c is now in the nondiabetic range. He should be seen before the metformin is refilled; it may not be necessary.

## 2014-01-02 ENCOUNTER — Ambulatory Visit (INDEPENDENT_AMBULATORY_CARE_PROVIDER_SITE_OTHER): Payer: Managed Care, Other (non HMO) | Admitting: Internal Medicine

## 2014-01-02 ENCOUNTER — Encounter: Payer: Self-pay | Admitting: Internal Medicine

## 2014-01-02 VITALS — BP 110/82 | HR 85 | Temp 98.7°F | Wt 229.8 lb

## 2014-01-02 DIAGNOSIS — R7309 Other abnormal glucose: Secondary | ICD-10-CM

## 2014-01-02 DIAGNOSIS — R7303 Prediabetes: Secondary | ICD-10-CM

## 2014-01-02 DIAGNOSIS — E786 Lipoprotein deficiency: Secondary | ICD-10-CM

## 2014-01-02 DIAGNOSIS — E781 Pure hyperglyceridemia: Secondary | ICD-10-CM

## 2014-01-02 MED ORDER — METFORMIN HCL 500 MG PO TABS: ORAL_TABLET | ORAL | Status: DC

## 2014-01-02 NOTE — Progress Notes (Signed)
Pre visit review using our clinic review tool, if applicable. No additional management support is needed unless otherwise documented below in the visit note. 

## 2014-01-02 NOTE — Progress Notes (Signed)
   Subjective:    Patient ID: James Bautista, male    DOB: 06-Oct-1980, 33 y.o.   MRN: [id number]  HPI   He is here for followup of his diabetes and dyslipidemia.  He does not monitor sugars at home. He is on no specific nutrition program.  He walks 30 minutes most days. He will have some discomfort in his calves which relates to some disorder of the tendons. He states that he will develop knots after walking on asphalt.  His maternal grandfather, paternal aunt, father had type 2 diabetes  Paternal grandfather had a myocardial infarction in his 15s and maternal grandfather at 48.    Review of Systems   He denies polyuria, polydipsia, or polyphagia.  He has no nonhealing skin lesions.  He has no peripheral neuropathy symptomatology.  Chest pain, palpitations, tachycardia, exertional dyspnea, paroxysmal nocturnal dyspnea,  or edema are absent.       Objective:   Physical Exam Appears healthy and well-nourished & in no acute distress  As per CDC Guidelines ,Epic documents obesity as being present . No carotid bruits are present.No neck pain distention present at 10 - 15 degrees. Thyroid normal to palpation Heart rhythm and rate are normal with no gallop or murmur Chest is clear with no increased work of breathing There is no evidence of aortic aneurysm or renal artery bruits Abdomen soft with no organomegaly or masses. No HJR No clubbing, cyanosis or edema present. Pedal pulses are intact  No ischemic skin changes are present . Fingernails healthy  Alert and oriented. Strength, tone, DTRs reflexes normal         Assessment & Plan:  #1 severely uncontrolled diabetes has been reversed; A1c is now in the nondiabetic range. His risk and options were discussed.  #2 dyslipidemia with elevated triglycerides and low HDL.  Plan: He will decide whether he wants to do nutrition and exercise alone or continue metformin once daily.  Lipids and A1c will be rechecked in late  November whether he takes the one metformin daily or not.

## 2014-01-02 NOTE — Telephone Encounter (Signed)
Patient has been advised

## 2014-01-02 NOTE — Patient Instructions (Addendum)
The most common cause of elevated triglycerides (TG) is the ingestion of sugar from high fructose corn syrup sources added to processed foods & drinks.  Eat a low-fat diet with lots of fruits and vegetables, up to 7-9 servings per day. Consume less than 40  Grams (preferably ZERO) of sugar per day from foods & drinks with High Fructose Corn Syrup (HFCS) sugar as #1,2,3 or # 4 on label.[employer], [employer] do not carry products with HFCS. Follow a  low carb nutrition program such as [place] or The New Sugar Busters  to prevent Diabetes progression . White carbohydrates (potatoes, rice, bread, and pasta) have a high spike of sugar and a high load of sugar. For example a  baked potato has a cup of sugar and a  french fry  2 teaspoons of sugar. Yams, wild  rice, whole grained bread &  wheat pasta have been much lower spike and load of  sugar. Portions should be the size of a deck of cards or your palm.   HDL goal is > 40 in men .Interventions to raise HDL or GOOD cholesterol include: exercising 30-45 minutes 3-4 X per week ; dietary sources of Omega 3 fatty acids ( salmon & tuna) or  supplementing with Flax seed  1-2 grams per day.

## 2014-02-28 ENCOUNTER — Encounter: Payer: Managed Care, Other (non HMO) | Admitting: Internal Medicine

## 2014-04-04 ENCOUNTER — Encounter: Payer: Self-pay | Admitting: Internal Medicine

## 2014-04-11 ENCOUNTER — Ambulatory Visit (INDEPENDENT_AMBULATORY_CARE_PROVIDER_SITE_OTHER): Payer: Managed Care, Other (non HMO) | Admitting: Internal Medicine

## 2014-04-11 ENCOUNTER — Other Ambulatory Visit (INDEPENDENT_AMBULATORY_CARE_PROVIDER_SITE_OTHER): Payer: Managed Care, Other (non HMO)

## 2014-04-11 ENCOUNTER — Encounter: Payer: Self-pay | Admitting: Internal Medicine

## 2014-04-11 VITALS — BP 120/82 | HR 80 | Temp 98.2°F | Ht 72.0 in | Wt 233.0 lb

## 2014-04-11 DIAGNOSIS — E119 Type 2 diabetes mellitus without complications: Secondary | ICD-10-CM

## 2014-04-11 DIAGNOSIS — E1310 Other specified diabetes mellitus with ketoacidosis without coma: Secondary | ICD-10-CM

## 2014-04-11 DIAGNOSIS — Z23 Encounter for immunization: Secondary | ICD-10-CM

## 2014-04-11 DIAGNOSIS — Z0000 Encounter for general adult medical examination without abnormal findings: Secondary | ICD-10-CM

## 2014-04-11 DIAGNOSIS — E1110 Type 2 diabetes mellitus with ketoacidosis without coma: Secondary | ICD-10-CM

## 2014-04-11 DIAGNOSIS — E669 Obesity, unspecified: Secondary | ICD-10-CM

## 2014-04-11 LAB — HEMOGLOBIN A1C: Hgb A1c MFr Bld: 6.3 % (ref 4.6–6.5)

## 2014-04-11 MED ORDER — TRIAMCINOLONE ACETONIDE 0.1 % EX CREA: 1.0000 "application " | TOPICAL_CREAM | Freq: Two times a day (BID) | CUTANEOUS | Status: DC

## 2014-04-11 NOTE — Progress Notes (Signed)
Pre visit review using our clinic review tool, if applicable. No additional management support is needed unless otherwise documented below in the visit note. 

## 2014-04-11 NOTE — Patient Instructions (Signed)
We have sent in a refill of your Kenalog. We will also check your blood work and call you with the results.  We will see back in about 6 months to check on your hemoglobin A1c as long as things are going well today if the hemoglobin A1c is up today will probably like to see you back in about 3 months.  Diabetes and Exercise Exercising regularly is important. It is not just about losing weight. It has many health benefits, such as:  Improving your overall fitness, flexibility, and endurance.  Increasing your bone density.  Helping with weight control.  Decreasing your body fat.  Increasing your muscle strength.  Reducing stress and tension.  Improving your overall health. People with diabetes who exercise gain additional benefits because exercise:  Reduces appetite.  Improves the body's use of blood sugar (glucose).  Helps lower or control blood glucose.  Decreases blood pressure.  Helps control blood lipids (such as cholesterol and triglycerides).  Improves the body's use of the hormone insulin by:  Increasing the body's insulin sensitivity.  Reducing the body's insulin needs.  Decreases the risk for heart disease because exercising:  Lowers cholesterol and triglycerides levels.  Increases the levels of good cholesterol (such as high-density lipoproteins [HDL]) in the body.  Lowers blood glucose levels. YOUR ACTIVITY PLAN  Choose an activity that you enjoy and set realistic goals. Your health care provider or diabetes educator can help you make an activity plan that works for you. Exercise regularly as directed by your health care provider. This includes:  Performing resistance training twice a week such as push-ups, sit-ups, lifting weights, or using resistance bands.  Performing 150 minutes of cardio exercises each week such as walking, running, or playing sports.  Staying active and spending no more than 90 minutes at one time being inactive. Even short bursts  of exercise are good for you. Three 10-minute sessions spread throughout the day are just as beneficial as a single 30-minute session. Some exercise ideas include:  Taking the dog for a walk.  Taking the stairs instead of the elevator.  Dancing to your favorite song.  Doing an exercise video.  Doing your favorite exercise with a friend. RECOMMENDATIONS FOR EXERCISING WITH TYPE 1 OR TYPE 2 DIABETES   Check your blood glucose before exercising. If blood glucose levels are greater than 240 mg/dL, check for urine ketones. Do not exercise if ketones are present.  Avoid injecting insulin into areas of the body that are going to be exercised. For example, avoid injecting insulin into:  The arms when playing tennis.  The legs when jogging.  Keep a record of:  Food intake before and after you exercise.  Expected peak times of insulin action.  Blood glucose levels before and after you exercise.  The type and amount of exercise you have done.  Review your records with your health care provider. Your health care provider will help you to develop guidelines for adjusting food intake and insulin amounts before and after exercising.  If you take insulin or oral hypoglycemic agents, watch for signs and symptoms of hypoglycemia. They include:  Dizziness.  Shaking.  Sweating.  Chills.  Confusion.  Drink plenty of water while you exercise to prevent dehydration or heat stroke. Body water is lost during exercise and must be replaced.  Talk to your health care provider before starting an exercise program to make sure it is safe for you. Remember, almost any type of activity is better than none. Document  Released: 07/19/2003 Document Revised: 09/12/2013 Document Reviewed: 10/05/2012 [facility] Patient Information 2015 [place], [place]. This information is not intended to replace advice given to you by your health care provider. Make sure you discuss any questions you have with your health  care provider.

## 2014-04-12 NOTE — Assessment & Plan Note (Signed)
Patient given flu shot at today's visit, up-to-date on tetanus shot. Foot exam done today, reminded about eye exam. Patient would like to think about pneumonia shot.

## 2014-04-12 NOTE — Progress Notes (Signed)
   Subjective:    Patient ID: James Bautista, male    DOB: 04/27/1981, 33 y.o.   MRN: [id number]  HPI The patient is a 33 year old male who comes in today to establish care. He does have past medical history of type 2 diabetes, obesity. He is coming in today to establish care and check in on his hemoglobin A1c. He was seen previously and told he could stop the metformin. He has been trying to do better with diet and exercise. 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 He denies any chest pain, shortness of breath, abdominal pain, constipation, diarrhea.  Review of Systems  Constitutional: Negative for fever, activity change, appetite change, fatigue and unexpected weight change.  HENT: Negative.   Respiratory: Negative for cough, chest tightness, shortness of breath and wheezing.   Cardiovascular: Negative for chest pain, palpitations and leg swelling.  Gastrointestinal: Negative for nausea, abdominal pain, diarrhea, constipation and abdominal distention.  Musculoskeletal: Negative.   Skin: Negative.   Neurological: Negative.   Psychiatric/Behavioral: Negative.       Objective:   Physical Exam  Constitutional: He is oriented to person, place, and time. He appears well-developed and well-nourished.  Overweight  HENT:  Head: Normocephalic and atraumatic.  Eyes: EOM are normal.  Neck: Normal range of motion.  Cardiovascular: Normal rate and regular rhythm.   Pulmonary/Chest: Effort normal and breath sounds normal. No respiratory distress. He has no wheezes. He has no rales.  Abdominal: Soft. Bowel sounds are normal. He exhibits no distension. There is no tenderness. There is no rebound.  Musculoskeletal: He exhibits no edema.  Neurological: He is alert and oriented to person, place, and time. Coordination normal.  Skin: Skin is warm and dry.   Filed Vitals:   04/11/14 1546  BP: 120/82  Pulse: 80  Temp: 98.2 F (36.8 C)  TempSrc: Oral  Height: 6' (1.829 m)  Weight: 233 lb  (105.688 kg)  SpO2: 97%      Assessment & Plan:

## 2014-04-12 NOTE — Assessment & Plan Note (Signed)
Last hemoglobin A1c well controlled off medication. We'll recheck an A1c today. Foot exam done today. Reminded him about eye exam yearly.

## 2014-04-12 NOTE — Assessment & Plan Note (Signed)
Talked with patient about diet and exercise and he would like to try to lose weight on his own.

## 2016-01-22 ENCOUNTER — Telehealth: Payer: Self-pay | Admitting: Internal Medicine

## 2016-01-22 NOTE — Telephone Encounter (Signed)
PLEASE NOTE: All timestamps contained within this report are represented as [place] Standard Time. CONFIDENTIALTY NOTICE: This fax transmission is intended only for the addressee. It contains information that is legally privileged, confidential or otherwise protected from use or disclosure. If you are not the intended recipient, you are strictly prohibited from reviewing, disclosing, copying using or disseminating any of this information or taking any action in reliance on or regarding this information. If you have received this fax in error, please notify us immediately by telephone so that we can arrange for its return to [place]. Phone: [phone], Toll-Free: [phone], Fax: [phone] Page: 1 of 1 Call Id: [id number] [facility] Patient Name: James Bautista DOB: November 24, 1980 Initial Comment Caller states he has been vomiting and having diarrhea. He's urinating. Nurse Assessment Nurse: Dimas Chyle, RN, Dellis Filbert Date/Time Eilene Ghazi Time): 01/22/2016 1:46:24 PM Confirm and document reason for call. If symptomatic, describe symptoms. You must click the next button to save text entered. ---Caller states he has been vomiting and having diarrhea. He's urinating. Symptoms started on Saturday night. Unsure of fever. No vomiting today. 2 bouts of diarrhea today. Has the patient traveled out of the country within the last 30 days? ---No Does the patient have any new or worsening symptoms? ---Yes Will a triage be completed? ---Yes Related visit to physician within the last 2 weeks? ---No Does the PT have any chronic conditions? (i.e. diabetes, asthma, etc.) ---No Is this a behavioral health or substance abuse call? ---No Guidelines Guideline Title Affirmed Question Affirmed Notes Vomiting MILD vomiting with diarrhea (all triage questions negative) Final Disposition User Dellroy, RN, Dellis Filbert Disagree/Comply:  Comply

## 2016-06-18 ENCOUNTER — Telehealth: Payer: Self-pay | Admitting: Internal Medicine

## 2016-06-18 NOTE — Telephone Encounter (Signed)
Patient called and said his wife tested + for the flu. He said her doctor said he should start tamiflu. He states he does not have any sx. He said he would do whatever you think he should do. He would like the nurse to call him and inform him. Thank you.

## 2016-06-19 MED ORDER — OSELTAMIVIR PHOSPHATE 75 MG PO CAPS: 75.0000 mg | ORAL_CAPSULE | Freq: Two times a day (BID) | ORAL | 0 refills | Status: DC

## 2016-06-19 NOTE — Telephone Encounter (Signed)
Sent in tamiflu. Take 1 pill twice daily for 5 days.

## 2016-06-19 NOTE — Telephone Encounter (Signed)
Patient aware.

## 2016-07-05 DIAGNOSIS — J181 Lobar pneumonia, unspecified organism: Secondary | ICD-10-CM | POA: Diagnosis not present

## 2016-07-05 DIAGNOSIS — R0602 Shortness of breath: Secondary | ICD-10-CM | POA: Diagnosis not present

## 2016-07-05 DIAGNOSIS — R05 Cough: Secondary | ICD-10-CM | POA: Diagnosis not present

## 2017-06-12 DIAGNOSIS — J181 Lobar pneumonia, unspecified organism: Secondary | ICD-10-CM | POA: Diagnosis not present

## 2017-06-12 DIAGNOSIS — R05 Cough: Secondary | ICD-10-CM | POA: Diagnosis not present

## 2017-07-13 ENCOUNTER — Encounter: Payer: Self-pay | Admitting: Internal Medicine

## 2017-07-17 DIAGNOSIS — J0100 Acute maxillary sinusitis, unspecified: Secondary | ICD-10-CM | POA: Diagnosis not present

## 2017-10-04 ENCOUNTER — Other Ambulatory Visit: Payer: Self-pay

## 2017-10-04 ENCOUNTER — Emergency Department (HOSPITAL_COMMUNITY)
Admission: EM | Admit: 2017-10-04 | Discharge: 2017-10-04 | Disposition: A | Discharge status: Home / Self Care | Payer: 59 | Attending: Physician Assistant | Admitting: Physician Assistant

## 2017-10-04 ENCOUNTER — Encounter (HOSPITAL_COMMUNITY): Payer: Self-pay

## 2017-10-04 DIAGNOSIS — R6884 Jaw pain: Secondary | ICD-10-CM | POA: Diagnosis not present

## 2017-10-04 DIAGNOSIS — E119 Type 2 diabetes mellitus without complications: Secondary | ICD-10-CM | POA: Diagnosis not present

## 2017-10-04 DIAGNOSIS — K0889 Other specified disorders of teeth and supporting structures: Secondary | ICD-10-CM | POA: Diagnosis present

## 2017-10-04 DIAGNOSIS — R030 Elevated blood-pressure reading, without diagnosis of hypertension: Secondary | ICD-10-CM | POA: Diagnosis not present

## 2017-10-04 DIAGNOSIS — Z8679 Personal history of other diseases of the circulatory system: Secondary | ICD-10-CM | POA: Insufficient documentation

## 2017-10-04 DIAGNOSIS — Z79899 Other long term (current) drug therapy: Secondary | ICD-10-CM | POA: Diagnosis not present

## 2017-10-04 LAB — BASIC METABOLIC PANEL
ANION GAP: 8 (ref 5–15)
BUN: 11 mg/dL (ref 6–20)
CHLORIDE: 100 mmol/L — AB (ref 101–111)
CO2: 29 mmol/L (ref 22–32)
Calcium: 9.2 mg/dL (ref 8.9–10.3)
Creatinine, Ser: 1.07 mg/dL (ref 0.61–1.24)
GFR calc Af Amer: >60 mL/min (ref >60)
GFR calc non Af Amer: >60 mL/min (ref >60)
Glucose, Bld: 202 mg/dL — ABNORMAL HIGH (ref 65–99)
POTASSIUM: 3.8 mmol/L (ref 3.5–5.1)
Sodium: 137 mmol/L (ref 135–145)

## 2017-10-04 LAB — CBC
HCT: 49.7 % (ref 39.0–52.0)
Hemoglobin: 16.8 g/dL (ref 13.0–17.0)
MCH: 25.7 pg — ABNORMAL LOW (ref 26.0–34.0)
MCHC: 33.8 g/dL (ref 30.0–36.0)
MCV: 76 fL — AB (ref 78.0–100.0)
Platelets: 189 10*3/uL (ref 150–400)
RBC: 6.54 MIL/uL — ABNORMAL HIGH (ref 4.22–5.81)
RDW: 13.8 % (ref 11.5–15.5)
WBC: 8.5 10*3/uL (ref 4.0–10.5)

## 2017-10-04 LAB — URINALYSIS, ROUTINE W REFLEX MICROSCOPIC
Bacteria, UA: NONE SEEN
Bilirubin Urine: NEGATIVE
Glucose, UA: >=500 mg/dL — AB
Hgb urine dipstick: NEGATIVE
KETONES UR: 5 mg/dL — AB
LEUKOCYTES UA: NEGATIVE
Nitrite: NEGATIVE
PH: 5 (ref 5.0–8.0)
PROTEIN: 30 mg/dL — AB
Specific Gravity, Urine: 1.024 (ref 1.005–1.030)

## 2017-10-04 NOTE — Discharge Instructions (Signed)
Your blood pressure reading today was elevated.  Your blood sugar was elevated as well. Please establish care with a primary care provider or with the wellness center listed below for further evaluation of your high blood pressure and blood sugars. Please return to the ED for any chest pain, shortness of breath, headache, vision changes, numbness in arms or legs, facial swelling.

## 2017-10-04 NOTE — ED Notes (Signed)
Pt was sent over from urgent care with complaint of dental pain. Pt was noted to have HTN at [facility], sent over for further evaluation. Pt denies any complaints of chest pain, shortness of breath, dizziness. No distress noted during assessment.

## 2017-10-04 NOTE — ED Triage Notes (Signed)
Pt presents for evaluation of hypertension. Pt states has dental pain from wisdom teeth and went to [facility] today. Pt was given pain meds and abx but told he needed to come here for further evaluation because of hypertension. Pt denies being on BP meds. Denies chest pain.

## 2017-10-04 NOTE — ED Provider Notes (Signed)
[facility] Provider Note   CSN: [id number] Arrival date & time: 10/04/17  1224     History   Chief Complaint Chief Complaint  Patient presents with  . Hypertension    HPI James Bautista is a 37 y.o. male who presents to ED for evaluation of high blood pressure.  Patient states that he has been suffering from dental pain for the past several days and went to urgent care today to have it evaluated.  He was given pain medications but was sent here to the emergency room because his blood pressure was 166/106.  He states that he has been told he has had high blood pressure readings in the past but mostly when he has a reason for it such as pain.  He states that currently his pain is well controlled with no pain medications.  He has not been ever diagnosed with hypertension nor has he been on any hypertension medications.  He denies any chest pain, shortness of breath, vision changes, headache, changes in urination, arms or legs.   He has not seen a primary care provider in several years.  HPI  Past Medical History:  Diagnosis Date  . Allergy    seasonal  . Hyperlipemia   . Kidney stone    never passed a stone or had radiographic evidence of stone.   . Prostatitis 2010   had to wear catheter for urinary retention. [facility] urologist.     Patient Active Problem List   Diagnosis Date Noted  . Obesity 02/09/2013  . Type 2 diabetes mellitus ([place]) 12/06/2010  . Routine general medical examination at a health care facility 12/06/2010    Past Surgical History:  Procedure Laterality Date  . FEMUR FRACTURE SURGERY  '92   right leg.  . Lasix           Home Medications    Prior to Admission medications   Medication Sig Start Date End Date Taking? Authorizing Provider  oseltamivir (TAMIFLU) 75 MG capsule Take 1 capsule (75 mg total) by mouth 2 (two) times daily. 06/19/16   Hoyt Koch, MD  triamcinolone cream (KENALOG) 0.1 % Apply 1  application topically 2 (two) times daily. 04/11/14   Hoyt Koch, MD    Family History Family History  Problem Relation Age of Onset  . Diabetes Father   . Hyperlipidemia Father   . Goiter Sister   . Cancer Paternal Aunt        ovarian cancer  . Cancer Maternal Grandmother        breast  . Heart disease Maternal Grandfather     Social History Social History   Tobacco Use  . Smoking status: Never Smoker  . Smokeless tobacco: Never Used  Substance Use Topics  . Alcohol use: No    Alcohol/week: 0.5 oz    Types: 1 Standard drinks or equivalent per week  . Drug use: No     Allergies   Banana and Neomycin   Review of Systems Review of Systems  Constitutional: Negative for appetite change, chills and fever.  HENT: Negative for ear pain, rhinorrhea, sneezing and sore throat.   Eyes: Negative for photophobia and visual disturbance.  Respiratory: Negative for cough, chest tightness, shortness of breath and wheezing.   Cardiovascular: Negative for chest pain and palpitations.  Gastrointestinal: Negative for abdominal pain, blood in stool, constipation, diarrhea, nausea and vomiting.  Genitourinary: Negative for dysuria, hematuria and urgency.  Musculoskeletal: Negative for myalgias.  Skin:  Negative for rash.  Neurological: Negative for dizziness, weakness and light-headedness.     Physical Exam Updated Vital Signs BP (!) 152/98   Pulse 82   Temp 98.4 F (36.9 C) (Oral)   Resp 20   SpO2 98%   Physical Exam  Constitutional: He appears well-developed and well-nourished. No distress.  HENT:  Head: Normocephalic and atraumatic.  Nose: Nose normal.  Eyes: Pupils are equal, round, and reactive to light. Conjunctivae and EOM are normal. Right eye exhibits no discharge. Left eye exhibits no discharge. No scleral icterus.  Neck: Normal range of motion. Neck supple.  Cardiovascular: Normal rate, regular rhythm, normal heart sounds and intact distal pulses. Exam  reveals no gallop and no friction rub.  No murmur heard. Pulmonary/Chest: Effort normal and breath sounds normal. No respiratory distress.  Abdominal: Soft. Bowel sounds are normal. He exhibits no distension. There is no tenderness. There is no guarding.  Musculoskeletal: Normal range of motion. He exhibits no edema.  Neurological: He is alert. He exhibits normal muscle tone. Coordination normal.  Skin: Skin is warm and dry. No rash noted.  Psychiatric: He has a normal mood and affect.  Nursing note and vitals reviewed.    ED Treatments / Results  Labs (all labs ordered are listed, but only abnormal results are displayed) Labs Reviewed  BASIC METABOLIC PANEL - Abnormal; Notable for the following components:      Result Value   Chloride 100 (*)    Glucose, Bld 202 (*)    All other components within normal limits  CBC - Abnormal; Notable for the following components:   RBC 6.54 (*)    MCV 76.0 (*)    MCH 25.7 (*)    All other components within normal limits  URINALYSIS, ROUTINE W REFLEX MICROSCOPIC - Abnormal; Notable for the following components:   Glucose, UA >=500 (*)    Ketones, ur 5 (*)    Protein, ur 30 (*)    All other components within normal limits    EKG None  Radiology No results found.  Procedures Procedures (including critical care time)  Medications Ordered in ED Medications - No data to display   Initial Impression / Assessment and Plan / ED Course  I have reviewed the triage vital signs and the nursing notes.  Pertinent labs & imaging results that were available during my care of the patient were reviewed by me and considered in my medical decision making (see chart for details).  Clinical Course as of Oct 04 1644  Sun Oct 04, 2017  1256 BP(!): 180/110 [HK]  1600 BP(!): 147/97 [HK]    Clinical Course User Index [HK] Delia Heady, PA-C    Patient presents to ED for evaluation of high blood pressure reading.  He was seen and evaluated at the  urgent care center earlier today for dental pain.  He reports severe pain at that time.  He was found to have blood pressure of 166/106 and was sent to the emergency room for further evaluation.  He reports he does have high blood pressure readings in the past but usually for reasons such as pain.  He has never been on any hypertension medications.    Patient denies headache, change in vision, numbness, weakness, chest pain, dyspnea, dizziness, or lightheadedness therefore doubt hypertensive emergency. Discussed elevated blood pressure with the patient and the need for primary care follow up with potential need to initiate or change antihypertensive medications and or for further evaluation. Discussed return precaution signs/symptoms  for hypertensive emergency as listed above with the patient.  Incidentally, patient found to have blood sugar of 200.  He states that he used to be on metformin for diabetes but now has been controlling his blood sugars with diet.  He denies any need for further treatment at this time.  He denies any vomiting.  He does not appear dehydrated.  He is not in DKA.  He would rather follow-up establish with a primary care provider for further evaluation of his high blood pressure and blood sugars.  He is comfortable for discharge home.  I did advise him to take the antibiotics and pain medication as needed for the dental pain.  Of note, patient's blood pressure has improved while here in the ED with rest and pain control.  I suspect the blood pressure elevation was secondary to his discomfort, but did encourage him to follow up and monitor.  Advised to return to ED for any severe worsening symptoms.  Portions of this note were generated with [employer]. Dictation errors may occur despite best attempts at proofreading.   Final Clinical Impressions(s) / ED Diagnoses   Final diagnoses:  History of high blood pressure    ED Discharge Orders    None       Delia Heady, PA-C 10/04/17 1648    [place], Fredia Sorrow, MD 10/04/17 2027

## 2017-10-08 ENCOUNTER — Encounter: Payer: Self-pay | Admitting: Family Medicine

## 2017-10-08 ENCOUNTER — Ambulatory Visit: Payer: 59 | Admitting: Family Medicine

## 2017-10-08 VITALS — BP 134/82 | HR 92 | Temp 98.2°F | Ht 72.0 in | Wt 230.6 lb

## 2017-10-08 DIAGNOSIS — J302 Other seasonal allergic rhinitis: Secondary | ICD-10-CM | POA: Diagnosis not present

## 2017-10-08 DIAGNOSIS — Z23 Encounter for immunization: Secondary | ICD-10-CM

## 2017-10-08 DIAGNOSIS — R030 Elevated blood-pressure reading, without diagnosis of hypertension: Secondary | ICD-10-CM

## 2017-10-08 DIAGNOSIS — E1165 Type 2 diabetes mellitus with hyperglycemia: Secondary | ICD-10-CM | POA: Diagnosis not present

## 2017-10-08 LAB — POCT GLYCOSYLATED HEMOGLOBIN (HGB A1C): HEMOGLOBIN A1C: 7.7 % — AB (ref 4.0–5.6)

## 2017-10-08 LAB — MICROALBUMIN / CREATININE URINE RATIO
Creatinine,U: 159.6 mg/dL
Microalb Creat Ratio: 8.5 mg/g (ref 0.0–30.0)
Microalb, Ur: 13.6 mg/dL — ABNORMAL HIGH (ref 0.0–1.9)

## 2017-10-08 NOTE — Progress Notes (Signed)
Subjective:  James Bautista is a 37 y.o. male who presents today with a chief complaint of elevated blood pressure reading and to establish care.   HPI:  Elevated Blood Pressure Reading, New Problem Patient presented to urgent care four days ago for jaw pain.  Was found to have elevated blood pressure reading reportedly in the 160s over 130s.  He was then directed to go to the emergency department.  There, blood pressure gradually improved to the 130s over 90s.  He was discharged home without any further work-up at that time.  Over the last few days, he has checked his blood pressure at home and they have been stable, typically in the 130s to 140s.  No reported chest pain or shortness of breath.  No obvious alleviating or aggravating factors.   Type 2 diabetes, chronic problem, new to provider Several year history.  Been on metformin in the past but has not been on anything for the past several years.  Was told that he had elevated blood sugar level to the 200s during his ED visit.  Allergic rhinitis/seasonal allergies, chronic problem, new to provider Uses Flonase as needed.  Symptoms are stable.  ROS: Per HPI, otherwise a complete review of systems was negative.   PMH:  The following were reviewed and entered/updated in epic: Past Medical History:  Diagnosis Date  . Allergy    seasonal  . Hyperlipemia   . Kidney stone    never passed a stone or had radiographic evidence of stone.   . Prostatitis 2010   had to wear catheter for urinary retention. [facility] urologist.    Patient Active Problem List   Diagnosis Date Noted  . Seasonal allergies 10/08/2017  . Obesity 02/09/2013  . Type 2 diabetes mellitus ([place]) 12/06/2010   Past Surgical History:  Procedure Laterality Date  . FEMUR FRACTURE SURGERY  '92   right leg.  [name] LASIK      Family History  Problem Relation Age of Onset  . Diabetes Father   . Hyperlipidemia Father   . Goiter Sister   . Cancer Paternal Aunt    ovarian cancer  . Cancer Maternal Grandmother        breast  . COPD Maternal Grandmother   . Heart disease Maternal Grandfather   . Dementia Maternal Grandfather    Medications- reviewed and updated Current Outpatient Medications  Medication Sig Dispense Refill  . amoxicillin-clavulanate (AUGMENTIN) 875-125 MG tablet Take by mouth.    [name] LORazepam (ATIVAN) 2 MG tablet Take 2 mg by mouth once.    [name] HYDROcodone-acetaminophen (NORCO/VICODIN) 5-325 MG tablet Take by mouth.     No current facility-administered medications for this visit.     Allergies-reviewed and updated Allergies  Allergen Reactions  . Banana     Tastes like eating a porcupine   . Neomycin Other (See Comments)    Neomycin ear drops - ears swelled shut  . Nickel   . Peach [Prunus Persica]     Social History   Socioeconomic History  . Marital status: Married    Spouse name: Not on file  . Number of children: 0  . Years of education: 35  . Highest education level: Not on file  Occupational History  . Occupation: Chief Executive Officer  Social Needs  . Financial resource strain: Not on file  . Food insecurity:    Worry: Not on file    Inability: Not on file  . Transportation needs:    Medical: Not on  file    Non-medical: Not on file  Tobacco Use  . Smoking status: Never Smoker  . Smokeless tobacco: Never Used  Substance and Sexual Activity  . Alcohol use: No    Alcohol/week: 0.5 oz    Types: 1 Standard drinks or equivalent per week  . Drug use: No  . Sexual activity: Yes    Partners: Female  Lifestyle  . Physical activity:    Days per week: Not on file    Minutes per session: Not on file  . Stress: Not on file  Relationships  . Social connections:    Talks on phone: Not on file    Gets together: Not on file    Attends religious service: Not on file    Active member of club or organization: Not on file    Attends meetings of clubs or organizations: Not on file    Relationship status: Not on file  Other  Topics Concern  . Not on file  Social History Narrative   [place] - [place], [employer] 2010. Married. Uses condoms. 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   Objective:  Physical Exam: BP 134/82 (BP Location: Left Arm, Patient Position: Sitting, Cuff Size: Normal)   Pulse 92   Temp 98.2 F (36.8 C) (Oral)   Ht 6' (1.829 m)   Wt 230 lb 9.6 oz (104.6 kg)   SpO2 97%   BMI 31.27 kg/m   Gen: NAD, resting comfortably CV: RRR with no murmurs appreciated Pulm: NWOB, CTAB with no crackles, wheezes, or rhonchi GI: Normal bowel sounds present. Soft, Nontender, Nondistended. MSK: No edema, cyanosis, or clubbing noted Skin: Warm, dry Neuro: Grossly normal, moves all extremities Psych: Normal affect and thought content  Assessment/Plan:  Seasonal allergies Stable.  Continue Flonase as needed.  Type 2 diabetes mellitus A1c elevated to 7.7 today.  Discussed treatment options.  We will not restart metformin today.  Patient will work on lifestyle modifications including diet and exercise.  He will follow-up with me in 3 to 6 months.  We will need to recheck A1c at that time.  We will check urine microalbumin: Creatinine ratio today.  Pneumonia shot given today as well.  Elevated blood pressure reading Patient's blood pressure at goal today per JNC 8 guidelines.  Will not start any medications at this point.  Discussed home blood pressure monitoring with goal 140/90 or lower.  Discussed lifestyle modifications including regular exercise and low-salt diet.  Discussed reasons to return to care.  Follow-up in 3 6 months.  Patient is cleared to have dental surgery for wisdom tooth extraction.  [name]. Jerline Pain, MD 10/08/2017 2:09 PM

## 2017-10-08 NOTE — Patient Instructions (Addendum)
It was very nice to see you today!  Your blood pressure today is normal. Please keep a close on this.  Let me know if your blood pressure is persistently 140/90 or higher.  Please try working on increasing your activity level and keeping your salt intake low.  Your A1c today is 7.7.  This is a little higher than we would like.  Please continue working on your diet and exercise and we can recheck in 3 to 6 months.  We will give you a pneumonia vaccine today.  We will also check a urine test to look for signs of kidney damage.  I would like you to come back soon for your physical, however I want to make sure we are getting your blood sugar in a good place first.  Please let me know if you run into any issues before your next office visit.  Take care, Dr Jerline Pain

## 2017-10-08 NOTE — Addendum Note (Signed)
Addended by: Elmer Bales on: 10/08/2017 02:36 PM   Modules accepted: Orders

## 2017-10-08 NOTE — Assessment & Plan Note (Signed)
Stable.  Continue Flonase as needed.

## 2017-10-08 NOTE — Assessment & Plan Note (Addendum)
A1c elevated to 7.7 today.  Discussed treatment options.  We will not restart metformin today.  Patient will work on lifestyle modifications including diet and exercise.  He will follow-up with me in 3 to 6 months.  We will need to recheck A1c at that time.  We will check urine microalbumin: Creatinine ratio today.  Pneumonia shot given today as well.

## 2017-10-09 NOTE — Progress Notes (Signed)
Dr Marigene Ehlers interpretation of your lab work:  Your urine test is NORMAL. We do not need to make any changes to your treatment plan at this time. I will see you back in 3-6 months for your follow up visit.    If you have any additional questions, please give [place] a call or send [place] a message through [facility].  Take care, Dr Jerline Pain

## 2017-10-10 HISTORY — PX: WISDOM TOOTH EXTRACTION: SHX21

## 2018-02-05 ENCOUNTER — Ambulatory Visit: Payer: 59 | Admitting: Family Medicine

## 2018-02-09 ENCOUNTER — Encounter: Payer: Self-pay | Admitting: Family Medicine

## 2018-02-09 ENCOUNTER — Ambulatory Visit: Payer: 59 | Admitting: Family Medicine

## 2018-02-09 VITALS — BP 128/74 | HR 81 | Temp 97.8°F | Ht 72.0 in | Wt 229.6 lb

## 2018-02-09 DIAGNOSIS — Z23 Encounter for immunization: Secondary | ICD-10-CM | POA: Diagnosis not present

## 2018-02-09 DIAGNOSIS — E119 Type 2 diabetes mellitus without complications: Secondary | ICD-10-CM | POA: Diagnosis not present

## 2018-02-09 DIAGNOSIS — Z13220 Encounter for screening for lipoid disorders: Secondary | ICD-10-CM

## 2018-02-09 DIAGNOSIS — Z114 Encounter for screening for human immunodeficiency virus [HIV]: Secondary | ICD-10-CM

## 2018-02-09 LAB — POCT GLYCOSYLATED HEMOGLOBIN (HGB A1C): HEMOGLOBIN A1C: 8.6 % — AB (ref 4.0–5.6)

## 2018-02-09 MED ORDER — METFORMIN HCL ER 750 MG PO TB24: 750.0000 mg | ORAL_TABLET | Freq: Every day | ORAL | 3 refills | Status: DC

## 2018-02-09 NOTE — Progress Notes (Signed)
   Subjective:  James Bautista is a 37 y.o. male who presents today with a chief complaint of T2DM.   HPI:  T2DM, chronic problem, worsening Last seen 4 months ago for this. A1c at that time was elevated to 7.7. Patient declined starting medications at his last office visit.  He has been under quite a bit of stress recently due to building a house and admits to several dietary indiscretions.  He has been on metformin in the past which he tolerated well aside from loose stool the first couple weeks.  Wt Readings from Last 3 Encounters:  02/09/18 229 lb 9.6 oz (104.1 kg)  10/08/17 230 lb 9.6 oz (104.6 kg)  04/11/14 233 lb (105.7 kg)    ROS: Per HPI  PMH: He reports that he has never smoked. He has never used smokeless tobacco. He reports that he does not drink alcohol or use drugs.  Objective:  Physical Exam: BP 128/74 (BP Location: Left Arm, Patient Position: Sitting, Cuff Size: Normal)   Pulse 81   Temp 97.8 F (36.6 C) (Oral)   Ht 6' (1.829 m)   Wt 229 lb 9.6 oz (104.1 kg)   SpO2 97%   BMI 31.14 kg/m   Gen: NAD, resting comfortably CV: RRR with no murmurs appreciated Pulm: NWOB, CTAB with no crackles, wheezes, or rhonchi Diabetic Foot Exam - Simple   Simple Foot Form Diabetic Foot exam was performed with the following findings:  Yes 02/09/2018  8:27 AM  Visual Inspection No deformities, no ulcerations, no other skin breakdown bilaterally:  Yes Sensation Testing Intact to touch and monofilament testing bilaterally:  Yes Pulse Check Posterior Tibialis and Dorsalis pulse intact bilaterally:  Yes Comments     Assessment/Plan:  Type 2 diabetes mellitus A1c increased to 8.6 today.  Discussed treatment options with patient.  Will start metformin 750 mg daily.  Also recommended him follow-up with our nutritionist.  He will follow-up with me in 3 months.  Foot exam performed today.  Preventative Healthcare Patient was instructed to return soon for CPE.  Flu shot and foot exam  performed today.  Placed future orders for HIV antibody and lipid panel. Health Maintenance Due  Topic Date Due  . OPHTHALMOLOGY EXAM  03/29/1993  . HIV Screening  03/29/1998  . FOOT EXAM  04/13/2015  . INFLUENZA VACCINE  12/10/2017   James Bautista. James Pain, MD 02/09/2018 8:29 AM

## 2018-02-09 NOTE — Assessment & Plan Note (Signed)
A1c increased to 8.6 today.  Discussed treatment options with patient.  Will start metformin 750 mg daily.  Also recommended him follow-up with our nutritionist.  He will follow-up with me in 3 months.  Foot exam performed today.

## 2018-02-09 NOTE — Patient Instructions (Addendum)
It was very nice to see you today!  Your a1c is a bit higher than we need it to be.  Please start metformin 750mg  daily.  Please schedule an appointment with Aldona Bar soon.  Come back to see me in about 3 months for your physical. Please come in a few days before that to get your blood work done.   Take care, Dr Jerline Pain

## 2018-03-08 ENCOUNTER — Encounter: Payer: Self-pay | Admitting: Physician Assistant

## 2018-03-08 ENCOUNTER — Ambulatory Visit: Payer: 59 | Admitting: Physician Assistant

## 2018-03-08 VITALS — BP 118/88 | HR 81 | Temp 98.6°F | Resp 16 | Ht 72.0 in | Wt 231.0 lb

## 2018-03-08 DIAGNOSIS — E119 Type 2 diabetes mellitus without complications: Secondary | ICD-10-CM | POA: Diagnosis not present

## 2018-03-08 DIAGNOSIS — Z713 Dietary counseling and surveillance: Secondary | ICD-10-CM

## 2018-03-08 NOTE — Patient Instructions (Signed)
It was great to see you!  1. Try to increase your water intake throughout the day 2. Limit your ice cream 3. Work on increasing fresh fruits and vegetables 4. Limit carbohydrates portions, if you are not going to do whole wheat, then work on limiting portions of carboydrates  Take care,  Inda Coke PA-C

## 2018-03-08 NOTE — Progress Notes (Signed)
James Bautista is a 37 y.o. male here for a new problem.  History of Present Illness:   Chief Complaint  Patient presents with  . Nutrition Counseling    HPI  Patient is here to discuss nutrition counseling and diabetes management. Has a nickel allergy and his cousin also has one. 1.5 years ago he started avoiding pinto and black beans, whole wheat products because he was told that they may have nickel in them -- feels better without eating those.   In July 2012 was diagnosed with DM. HgbA1c per his report has been as high as 12 or 13 but he has only been on Metformin and has been able to control his HgbA1c through diet.  Allergic to bananas and peaches. Gets prickly sensation in his mouth when he eats these things. Does not have an Epi-Pen and does not want one.  Lab Results  Component Value Date   HGBA1C 8.6 (A) 02/09/2018   He re-started metformin on 02/09/18.  Dietary recall: Wakes up at [place] -- diet coke or water or vitamin water zero, fried chicken biscuit Lunch 1pm -- 80% of the time eats out, [place] dish with reduced carb intake, burger with fries/chicken tenders; diet coke or water Dinner 9p -- either eating at home -- protein of some sort, sides ("too much potatoes"), occasional pastas Snacks -- has ice cream in the summer, avoids cakes, eats candy around holidays Beverages  -- rare intake, very occasionally socially  Weight: Wt Readings from Last 3 Encounters:  03/08/18 231 lb (104.8 kg)  02/09/18 229 lb 9.6 oz (104.1 kg)  10/08/17 230 lb 9.6 oz (104.6 kg)  Not currently at his heaviest weight.  Exercise: "Terrible" -- little to none.  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  Support system: Wife  Sleep: Sleeps well and does not have concerns for sleep apnea  Goals: 1- Reduce HgbA1c 2- Lose weight 3- Increase activity  Past Efforts: Diet and exercise  Estimated daily energy needs: Calories: 1600 -1750  kcal Protein: 75-85 g Fluid: 2000 ml  Past Medical History:  Diagnosis Date  . Allergy    seasonal  . Hyperlipemia   . Kidney stone    never passed a stone or had radiographic evidence of stone.   . Prostatitis 2010   had to wear catheter for urinary retention. [facility] urologist.      Social History   Socioeconomic History  . Marital status: Married    Spouse name: Not on file  . Number of children: 0  . Years of education: 63  . Highest education level: Not on file  Occupational History  . Occupation: Chief Executive Officer  Social Needs  . Financial resource strain: Not on file  . Food insecurity:    Worry: Not on file    Inability: Not on file  . Transportation needs:    Medical: Not on file    Non-medical: Not on file  Tobacco Use  . Smoking status: Never Smoker  . Smokeless tobacco: Never Used  Substance and Sexual Activity  . Alcohol use: No    Alcohol/week: 1.0 standard drinks    Types: 1 Standard drinks or equivalent per week  . Drug use: No  . Sexual activity: Yes    Partners: Female  Lifestyle  . Physical activity:    Days per week: Not on file    Minutes per session: Not on file  . Stress: Not on file  Relationships  .  Social connections:    Talks on phone: Not on file    Gets together: Not on file    Attends religious service: Not on file    Active member of club or organization: Not on file    Attends meetings of clubs or organizations: Not on file    Relationship status: Not on file  . Intimate partner violence:    Fear of current or ex partner: Not on file    Emotionally abused: Not on file    Physically abused: Not on file    Forced sexual activity: Not on file  Other Topics Concern  . Not on file  Social History Narrative   [place], [employer] 2010. Married. Uses condoms. 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    Past Surgical History:  Procedure Laterality Date  . FEMUR FRACTURE SURGERY  '92   right leg.  [name] LASIK       Family History  Problem Relation Age of Onset  . Diabetes Father   . Hyperlipidemia Father   . Goiter Sister   . Cancer Paternal Aunt        ovarian cancer  . Cancer Maternal Grandmother        breast  . COPD Maternal Grandmother   . Heart disease Maternal Grandfather   . Dementia Maternal Grandfather     Allergies  Allergen Reactions  . Banana     Tastes like eating a porcupine   . Neomycin Other (See Comments)    Neomycin ear drops - ears swelled shut  . Nickel   . Peach [Prunus Persica]     Current Medications:   Current Outpatient Medications:  .  Fexofenadine HCl (ALLEGRA ALLERGY PO), Take by mouth daily as needed., Disp: , Rfl:  .  fluticasone (FLONASE) 50 MCG/ACT nasal spray, Place into both nostrils daily as needed for allergies or rhinitis., Disp: , Rfl:  .  metFORMIN (GLUCOPHAGE XR) 750 MG 24 hr tablet, Take 1 tablet (750 mg total) by mouth daily with breakfast., Disp: 90 tablet, Rfl: 3   Review of Systems:   ROS  Negative unless otherwise specified per HPI.  Vitals:   Vitals:   03/08/18 0744  BP: 118/88  Pulse: 81  Resp: 16  Temp: 98.6 F (37 C)  TempSrc: Oral  SpO2: 96%  Weight: 231 lb (104.8 kg)  Height: 6' (1.829 m)     Body mass index is 31.33 kg/m.  Physical Exam:   Physical Exam  Constitutional: He is oriented to person, place, and time. He appears well-developed and well-nourished.  HENT:  Head: Normocephalic.  Eyes: Pupils are equal, round, and reactive to light. Conjunctivae and EOM are normal.  Neck: Normal range of motion.  Pulmonary/Chest: Effort normal.  Musculoskeletal: Normal range of motion.  Neurological: He is alert and oriented to person, place, and time.  Skin: Skin is warm and dry.  Psychiatric: He has a normal mood and affect. His behavior is normal. Judgment and thought content normal.  Nursing note and vitals reviewed.   Assessment and Plan:    James Bautista was seen today for nutrition counseling.  Diagnoses  and all orders for this visit:  Encounter for nutritional counseling  Type 2 diabetes mellitus without complication, without long-term current use of insulin ([place])   Discussed specific, individualized recommendations regarding nutrition including decreasing sugary beverages, limiting portions, balancing out meals, and eating regularly throughout the day. Handouts provided included: Balanced Plate, Balanced Snack  List, Balanced Breakfast, 1800 Calorie Sample Menus. Provided emotional support and encouraged slow, steady weight loss. Patient's questions answered throughout encounter. Follow-up with me prn.  . Reviewed expectations re: course of current medical issues. . Discussed self-management of symptoms. . Outlined signs and symptoms indicating need for more acute intervention. . Patient verbalized understanding and all questions were answered. . See orders for this visit as documented in the electronic medical record. . Patient received an After-Visit Summary.  I spent 40 minutes with this patient, greater than 50% was face-to-face time counseling regarding the above diagnoses.  Inda Coke, PA-C

## 2018-03-10 ENCOUNTER — Encounter: Payer: Self-pay | Admitting: Physician Assistant

## 2018-03-15 ENCOUNTER — Emergency Department (HOSPITAL_COMMUNITY): Payer: 59

## 2018-03-15 ENCOUNTER — Encounter (HOSPITAL_COMMUNITY): Payer: Self-pay | Admitting: Emergency Medicine

## 2018-03-15 ENCOUNTER — Ambulatory Visit: Payer: Self-pay | Admitting: *Deleted

## 2018-03-15 ENCOUNTER — Inpatient Hospital Stay (HOSPITAL_COMMUNITY)
Admission: EM | Admit: 2018-03-15 | Discharge: 2018-03-19 | DRG: 058 | Disposition: A | Discharge status: Home / Self Care | Payer: 59 | Attending: Internal Medicine | Admitting: Internal Medicine

## 2018-03-15 ENCOUNTER — Other Ambulatory Visit: Payer: Self-pay

## 2018-03-15 DIAGNOSIS — Z8249 Family history of ischemic heart disease and other diseases of the circulatory system: Secondary | ICD-10-CM | POA: Diagnosis not present

## 2018-03-15 DIAGNOSIS — Z7984 Long term (current) use of oral hypoglycemic drugs: Secondary | ICD-10-CM

## 2018-03-15 DIAGNOSIS — R000 Tachycardia, unspecified: Secondary | ICD-10-CM | POA: Diagnosis present

## 2018-03-15 DIAGNOSIS — G379 Demyelinating disease of central nervous system, unspecified: Secondary | ICD-10-CM | POA: Diagnosis present

## 2018-03-15 DIAGNOSIS — E861 Hypovolemia: Secondary | ICD-10-CM | POA: Diagnosis present

## 2018-03-15 DIAGNOSIS — R471 Dysarthria and anarthria: Secondary | ICD-10-CM | POA: Diagnosis present

## 2018-03-15 DIAGNOSIS — E1165 Type 2 diabetes mellitus with hyperglycemia: Secondary | ICD-10-CM | POA: Diagnosis present

## 2018-03-15 DIAGNOSIS — J302 Other seasonal allergic rhinitis: Secondary | ICD-10-CM | POA: Diagnosis present

## 2018-03-15 DIAGNOSIS — E785 Hyperlipidemia, unspecified: Secondary | ICD-10-CM | POA: Diagnosis present

## 2018-03-15 DIAGNOSIS — G939 Disorder of brain, unspecified: Secondary | ICD-10-CM

## 2018-03-15 DIAGNOSIS — G375 Concentric sclerosis [Balo] of central nervous system: Secondary | ICD-10-CM | POA: Diagnosis present

## 2018-03-15 DIAGNOSIS — Z79899 Other long term (current) drug therapy: Secondary | ICD-10-CM

## 2018-03-15 DIAGNOSIS — C8590 Non-Hodgkin lymphoma, unspecified, unspecified site: Secondary | ICD-10-CM | POA: Diagnosis not present

## 2018-03-15 DIAGNOSIS — E860 Dehydration: Secondary | ICD-10-CM | POA: Diagnosis present

## 2018-03-15 DIAGNOSIS — Z809 Family history of malignant neoplasm, unspecified: Secondary | ICD-10-CM

## 2018-03-15 DIAGNOSIS — G936 Cerebral edema: Secondary | ICD-10-CM | POA: Diagnosis present

## 2018-03-15 DIAGNOSIS — Z888 Allergy status to other drugs, medicaments and biological substances status: Secondary | ICD-10-CM | POA: Diagnosis not present

## 2018-03-15 DIAGNOSIS — M5021 Other cervical disc displacement,  high cervical region: Secondary | ICD-10-CM | POA: Diagnosis not present

## 2018-03-15 DIAGNOSIS — Z833 Family history of diabetes mellitus: Secondary | ICD-10-CM

## 2018-03-15 DIAGNOSIS — Z825 Family history of asthma and other chronic lower respiratory diseases: Secondary | ICD-10-CM

## 2018-03-15 DIAGNOSIS — E669 Obesity, unspecified: Secondary | ICD-10-CM | POA: Diagnosis present

## 2018-03-15 DIAGNOSIS — R29810 Facial weakness: Secondary | ICD-10-CM | POA: Diagnosis present

## 2018-03-15 DIAGNOSIS — F064 Anxiety disorder due to known physiological condition: Secondary | ICD-10-CM | POA: Diagnosis present

## 2018-03-15 DIAGNOSIS — Z8349 Family history of other endocrine, nutritional and metabolic diseases: Secondary | ICD-10-CM | POA: Diagnosis not present

## 2018-03-15 DIAGNOSIS — R29818 Other symptoms and signs involving the nervous system: Secondary | ICD-10-CM | POA: Diagnosis not present

## 2018-03-15 DIAGNOSIS — G35 Multiple sclerosis: Secondary | ICD-10-CM | POA: Diagnosis not present

## 2018-03-15 DIAGNOSIS — M50223 Other cervical disc displacement at C6-C7 level: Secondary | ICD-10-CM | POA: Diagnosis not present

## 2018-03-15 DIAGNOSIS — E119 Type 2 diabetes mellitus without complications: Secondary | ICD-10-CM

## 2018-03-15 DIAGNOSIS — R4781 Slurred speech: Secondary | ICD-10-CM | POA: Diagnosis not present

## 2018-03-15 DIAGNOSIS — Z91018 Allergy to other foods: Secondary | ICD-10-CM

## 2018-03-15 HISTORY — DX: Type 2 diabetes mellitus without complications: E11.9

## 2018-03-15 LAB — COMPREHENSIVE METABOLIC PANEL
ALBUMIN: 4.1 g/dL (ref 3.5–5.0)
ALK PHOS: 90 U/L (ref 38–126)
ALT: 51 U/L — ABNORMAL HIGH (ref 0–44)
ANION GAP: 6 (ref 5–15)
AST: 26 U/L (ref 15–41)
BUN: 12 mg/dL (ref 6–20)
CALCIUM: 9.2 mg/dL (ref 8.9–10.3)
CO2: 25 mmol/L (ref 22–32)
Chloride: 104 mmol/L (ref 98–111)
Creatinine, Ser: 1 mg/dL (ref 0.61–1.24)
GFR calc Af Amer: >60 mL/min (ref >60)
GFR calc non Af Amer: >60 mL/min (ref >60)
Glucose, Bld: 198 mg/dL — ABNORMAL HIGH (ref 70–99)
Potassium: 4.1 mmol/L (ref 3.5–5.1)
SODIUM: 135 mmol/L (ref 135–145)
Total Bilirubin: 0.5 mg/dL (ref 0.3–1.2)
Total Protein: 7.4 g/dL (ref 6.5–8.1)

## 2018-03-15 LAB — I-STAT CHEM 8, ED
BUN: 15 mg/dL (ref 6–20)
CALCIUM ION: 1.13 mmol/L — AB (ref 1.15–1.40)
CHLORIDE: 101 mmol/L (ref 98–111)
CREATININE: 0.8 mg/dL (ref 0.61–1.24)
GLUCOSE: 195 mg/dL — AB (ref 70–99)
HCT: 49 % (ref 39.0–52.0)
Hemoglobin: 16.7 g/dL (ref 13.0–17.0)
POTASSIUM: 4.1 mmol/L (ref 3.5–5.1)
Sodium: 138 mmol/L (ref 135–145)
TCO2: 25 mmol/L (ref 22–32)

## 2018-03-15 LAB — CBG MONITORING, ED
GLUCOSE-CAPILLARY: 183 mg/dL — AB (ref 70–99)
GLUCOSE-CAPILLARY: 183 mg/dL — AB (ref 70–99)

## 2018-03-15 LAB — APTT: APTT: 29 s (ref 24–36)

## 2018-03-15 LAB — PROTIME-INR
INR: 1.03
PROTHROMBIN TIME: 13.4 s (ref 11.4–15.2)

## 2018-03-15 LAB — DIFFERENTIAL
ABS IMMATURE GRANULOCYTES: 0.04 10*3/uL (ref 0.00–0.07)
BASOS ABS: 0 10*3/uL (ref 0.0–0.1)
Basophils Relative: 1 %
EOS PCT: 1 %
Eosinophils Absolute: 0.1 10*3/uL (ref 0.0–0.5)
IMMATURE GRANULOCYTES: 1 %
LYMPHS PCT: 25 %
Lymphs Abs: 2 10*3/uL (ref 0.7–4.0)
Monocytes Absolute: 0.4 10*3/uL (ref 0.1–1.0)
Monocytes Relative: 5 %
NEUTROS ABS: 5.3 10*3/uL (ref 1.7–7.7)
Neutrophils Relative %: 67 %

## 2018-03-15 LAB — CBC
HEMATOCRIT: 49.5 % (ref 39.0–52.0)
Hemoglobin: 16 g/dL (ref 13.0–17.0)
MCH: 25.3 pg — ABNORMAL LOW (ref 26.0–34.0)
MCHC: 32.3 g/dL (ref 30.0–36.0)
MCV: 78.2 fL — ABNORMAL LOW (ref 80.0–100.0)
PLATELETS: 190 10*3/uL (ref 150–400)
RBC: 6.33 MIL/uL — ABNORMAL HIGH (ref 4.22–5.81)
RDW: 13.7 % (ref 11.5–15.5)
WBC: 8 10*3/uL (ref 4.0–10.5)
nRBC: 0 % (ref 0.0–0.2)

## 2018-03-15 LAB — I-STAT TROPONIN, ED: Troponin i, poc: 0 ng/mL (ref 0.00–0.08)

## 2018-03-15 MED ORDER — SODIUM CHLORIDE 0.9 % IV SOLN
INTRAVENOUS | Status: DC
  Administered 2018-03-15 – 2018-03-16 (×2): via INTRAVENOUS

## 2018-03-15 MED ORDER — INSULIN ASPART 100 UNIT/ML ~~LOC~~ SOLN
0.0000 [IU] | Freq: Every day | SUBCUTANEOUS | Status: DC
  Administered 2018-03-16: 3 [IU] via SUBCUTANEOUS
  Administered 2018-03-17: 2 [IU] via SUBCUTANEOUS
  Administered 2018-03-18: 3 [IU] via SUBCUTANEOUS

## 2018-03-15 MED ORDER — SODIUM CHLORIDE 0.9 % IV SOLN: 1000.0000 mg | Freq: Every day | INTRAVENOUS | Status: DC

## 2018-03-15 MED ORDER — SODIUM CHLORIDE 0.9 % IV SOLN
1000.0000 mg | Freq: Every day | INTRAVENOUS | Status: AC
  Administered 2018-03-16 – 2018-03-17 (×2): 1000 mg via INTRAVENOUS
  Filled 2018-03-15 (×2): qty 8

## 2018-03-15 MED ORDER — INSULIN ASPART 100 UNIT/ML ~~LOC~~ SOLN
0.0000 [IU] | Freq: Three times a day (TID) | SUBCUTANEOUS | Status: DC
  Administered 2018-03-16: 5 [IU] via SUBCUTANEOUS
  Administered 2018-03-16: 8 [IU] via SUBCUTANEOUS
  Administered 2018-03-16: 11 [IU] via SUBCUTANEOUS
  Administered 2018-03-17 (×3): 5 [IU] via SUBCUTANEOUS
  Administered 2018-03-18: 3 [IU] via SUBCUTANEOUS
  Administered 2018-03-18: 5 [IU] via SUBCUTANEOUS
  Administered 2018-03-18: 11 [IU] via SUBCUTANEOUS
  Administered 2018-03-19: 5 [IU] via SUBCUTANEOUS

## 2018-03-15 MED ORDER — PANTOPRAZOLE SODIUM 40 MG IV SOLR
40.0000 mg | Freq: Once | INTRAVENOUS | Status: AC
  Administered 2018-03-15: 40 mg via INTRAVENOUS
  Filled 2018-03-15: qty 40

## 2018-03-15 MED ORDER — SODIUM CHLORIDE 0.9 % IV SOLN
1000.0000 mg | Freq: Once | INTRAVENOUS | Status: DC
  Filled 2018-03-15: qty 8

## 2018-03-15 MED ORDER — GUAIFENESIN ER 1200 MG PO TB12: 1200.0000 mg | ORAL_TABLET | Freq: Every day | ORAL | Status: DC | PRN

## 2018-03-15 MED ORDER — GADOBUTROL 1 MMOL/ML IV SOLN
10.0000 mL | Freq: Once | INTRAVENOUS | Status: AC | PRN
  Administered 2018-03-15: 10 mL via INTRAVENOUS

## 2018-03-15 MED ORDER — PANTOPRAZOLE SODIUM 40 MG PO TBEC
40.0000 mg | DELAYED_RELEASE_TABLET | Freq: Every day | ORAL | Status: DC
  Administered 2018-03-16 – 2018-03-19 (×4): 40 mg via ORAL
  Filled 2018-03-15 (×5): qty 1

## 2018-03-15 MED ORDER — GUAIFENESIN ER 600 MG PO TB12
1200.0000 mg | ORAL_TABLET | Freq: Every day | ORAL | Status: DC | PRN
  Administered 2018-03-15: 1200 mg via ORAL
  Administered 2018-03-16: 600 mg via ORAL
  Administered 2018-03-16: 1200 mg via ORAL
  Administered 2018-03-17: 600 mg via ORAL
  Filled 2018-03-15 (×4): qty 2

## 2018-03-15 MED ORDER — ENOXAPARIN SODIUM 40 MG/0.4ML ~~LOC~~ SOLN
40.0000 mg | SUBCUTANEOUS | Status: DC
  Administered 2018-03-15: 40 mg via SUBCUTANEOUS
  Filled 2018-03-15: qty 0.4

## 2018-03-15 NOTE — ED Provider Notes (Signed)
[facility]ENC[facility] DEPART[facility] Provider Note   CSN: [id number] Arrival date & time: 03/15/18  1133     History   Chief Complaint Chief Complaint  Patient presents with  . Stroke Symptoms    HPI James Bautista is a 36 y.o. male with history of hyperlipidemia, seasonal allergies, type 2 diabetes mellitus presents for evaluation of acute onset, progressively worsening left-sided facial droop and slurred speech beginning yesterday at around 10 AM.  He states that he noticed some nasal congestion and sinus pressure and took some over-the-counter cold medicines with improvement in his congestion but notes ongoing slurred speech.  He states that he has noticed that when he eats he will bite his left cheek which is been ongoing for approximately 1 week.  He states that one week ago he also experienced a "searing "left-sided headache which resolved and has not recurred.  He denies vision changes, numbness, tingling, weakness, dizziness, or difficulty ambulating or swallowing.  He does note mild lightheadedness earlier today which has resolved.  He is a non-smoker, denies recreational drug use or excessive alcohol intake.  Is in travel out of the country.  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  The history is provided by the patient.    Past Medical History:  Diagnosis Date  . Allergy    seasonal  . Hyperlipemia   . Kidney stone    never passed a stone or had radiographic evidence of stone.   . Prostatitis 2010   had to wear catheter for urinary retention. [facility] urologist.     Patient Active Problem List   Diagnosis Date Noted  . Brain lesion 03/15/2018  . Multiple sclerosis ([place]) 03/15/2018  . Seasonal allergies 10/08/2017  . Obesity 02/09/2013  . Type 2 diabetes mellitus ([place]) 12/06/2010    Past Surgical History:  Procedure Laterality Date  . FEMUR FRACTURE SURGERY  '92   right leg.  [name] LASIK     . WISDOM TOOTH EXTRACTION Bilateral 10/2017        Home Medications    Prior to Admission medications   Medication Sig Start Date End Date Taking? Authorizing Provider  aspirin EC 325 MG tablet Take 325 mg by mouth once as needed (for headahces or pain).   Yes [provider]  fexofenadine (ALLEGRA) 180 MG tablet Take 180 mg by mouth daily as needed for allergies or rhinitis.   Yes [provider]  fluticasone (FLONASE) 50 MCG/ACT nasal spray Place 2 sprays into both nostrils daily as needed for allergies or rhinitis.    Yes [provider]  Guaifenesin (MUCUS RELIEF ER) 1200 MG TB12 Take 1,200 mg by mouth daily as needed (for nasal drainage).    Yes [provider]  metFORMIN (GLUCOPHAGE XR) 750 MG 24 hr tablet Take 1 tablet (750 mg total) by mouth daily with breakfast. 02/09/18  Yes Vivi Barrack, MD    Family History Family History  Problem Relation Age of Onset  . Diabetes Father   . Hyperlipidemia Father   . Goiter Sister   . Cancer Paternal Aunt        ovarian cancer  . Cancer Maternal Grandmother        breast  . COPD Maternal Grandmother   . Heart disease Paternal Grandmother   . Dementia Paternal Grandmother   . Thyroid disease Paternal Grandmother   . Diabetes Paternal Grandfather   . Heart  disease Paternal Grandfather   . Multiple sclerosis Neg Hx     Social History Social History   Tobacco Use  . Smoking status: Never Smoker  . Smokeless tobacco: Never Used  Substance Use Topics  . Alcohol use: No    Alcohol/week: 0.0 standard drinks    Comment: 1 drink per month  . Drug use: No     Allergies   Neomycin; Banana; Nickel; and Peach [prunus persica]   Review of Systems Review of Systems  Constitutional: Negative for chills and fever.  HENT: Positive for congestion.   Eyes: Negative for visual disturbance.  Respiratory: Negative for shortness of breath.   Cardiovascular: Negative for chest pain.    Gastrointestinal: Negative for abdominal pain, nausea and vomiting.  Musculoskeletal: Negative for gait problem.  Neurological: Positive for facial asymmetry, speech difficulty, light-headedness (resolved) and headaches (resolved). Negative for dizziness, weakness and numbness.  All other systems reviewed and are negative.    Physical Exam Updated Vital Signs BP (!) 155/106   Pulse 98   Temp 98 F (36.7 C) (Oral)   Resp (!) 22   SpO2 95%   Physical Exam  Constitutional: He appears well-developed and well-nourished. No distress.  HENT:  Head: Normocephalic and atraumatic.  Eyes: Pupils are equal, round, and reactive to light. Conjunctivae and EOM are normal. Right eye exhibits no discharge. Left eye exhibits no discharge.  Neck: Normal range of motion. Neck supple. No JVD present. No tracheal deviation present.  Cardiovascular: Normal rate, regular rhythm, normal heart sounds and intact distal pulses.  Pulmonary/Chest: Effort normal and breath sounds normal.  Abdominal: Soft. Bowel sounds are normal. He exhibits no distension. There is no tenderness. There is no guarding.  Musculoskeletal: He exhibits no edema.  Neurological: He is alert. A cranial nerve deficit and sensory deficit is present. He exhibits normal muscle tone. Coordination normal.  Mildly dysarthric speech noted.  Left-sided facial droop noted though eyebrows raise mostly symmetrically.  Able to close his eyes without difficulty.  No tongue fasciculations.  Slightly altered sensation to light touch of the left upper extremity.  5/5 strength of BUE and BLE major muscle groups.  Ambulates with good gait and balance, able to Heel Walk and Toe Walk without difficulty.  No pronator drift.  Romberg sign absent.  No dysmetria with finger-to-nose of the bilateral upper extremities though patient notes some difficulty on the left.  Skin: Skin is warm and dry. No erythema.  Psychiatric: He has a normal mood and affect. His behavior  is normal.  Nursing note and vitals reviewed.    ED Treatments / Results  Labs (all labs ordered are listed, but only abnormal results are displayed) Labs Reviewed  CBC - Abnormal; Notable for the following components:      Result Value   RBC 6.33 (*)    MCV 78.2 (*)    MCH 25.3 (*)    All other components within normal limits  COMPREHENSIVE METABOLIC PANEL - Abnormal; Notable for the following components:   Glucose, Bld 198 (*)    ALT 51 (*)    All other components within normal limits  CBG MONITORING, ED - Abnormal; Notable for the following components:   Glucose-Capillary 183 (*)    All other components within normal limits  I-STAT CHEM 8, ED - Abnormal; Notable for the following components:   Glucose, Bld 195 (*)    Calcium, Ion 1.13 (*)    All other components within normal limits  PROTIME-INR  APTT  DIFFERENTIAL  BASIC METABOLIC PANEL  CBC  I-STAT TROPONIN, ED    EKG EKG Interpretation  Date/Time:  Monday March 15 2018 11:38:41 EST Ventricular Rate:  117 PR Interval:  178 QRS Duration: 66 QT Interval:  298 QTC Calculation: 415 R Axis:   75 Text Interpretation:  Sinus tachycardia Anteroseptal infarct , age undetermined Abnormal ECG no prior available for comparison Confirmed by Quintella Reichert [phone]) on 03/15/2018 12:23:20 PM   Radiology Ct Head Wo Contrast  Result Date: 03/15/2018 CLINICAL DATA:  37 year old male with acute LEFT facial droop and slurred speech for 1 day. EXAM: CT HEAD WITHOUT CONTRAST TECHNIQUE: Contiguous axial images were obtained from the base of the skull through the vertex without intravenous contrast. COMPARISON:  None. FINDINGS: Brain: A small to moderate area of white matter edema in the subcortical RIGHT frontoparietal region is identified. The overlying cortex is not appear to be involved and likely represents vasogenic edema. No hemorrhage, midline shift, hydrocephalus or extra-axial collection. No definite venous thrombosis  identified. Vascular: No hyperdense vessel or unexpected calcification. Skull: Normal. Negative for fracture or focal lesion. Sinuses/Orbits: No acute finding. Other: None. IMPRESSION: 1. Small to moderate amount of RIGHT frontoparietal edema which appears to be vasogenic. MRI with and without contrast recommended for further evaluation. Electronically Signed   By: Margarette Canada M.D.   On: 03/15/2018 12:46   Mr Jeri Cos And Wo Contrast  Result Date: 03/15/2018 CLINICAL DATA:  37 year old male with abrupt onset left facial droop and slurred speech. Abnormal noncontrast head CT earlier suggesting vasogenic edema in the right hemisphere. EXAM: MRI HEAD WITHOUT AND WITH CONTRAST TECHNIQUE: Multiplanar, multiecho pulse sequences of the brain and surrounding structures were obtained without and with intravenous contrast. CONTRAST:  10 milliliters Gadavist COMPARISON:  Head CT without contrast 1233 hours today. FINDINGS: Brain: In the right middle frontal gyrus there is a 3 centimeter oval area of abnormal gray and white matter signal. There is moderate to intense increased trace diffusion signal throughout the lesion, but areas of both facilitated (centrally) and restricted (medial margin) diffusion. These areas have corresponding high (central) and intermediate T2 and FLAIR hyperintensity. Cortex and regional white matter are affected as seen on series 11, image 14. Following contrast there is indistinct petechial enhancement which is best demonstrated on series 13, image 14. All told, the lesion encompasses 26 x 27 x 20 millimeters (AP by transverse by CC). There is a paucity of regional mass effect, and no larger area of surrounding edema. No 2nd lesion or other area of abnormal enhancement or signal abnormality is identified. No hemosiderin or abnormal mineralization. Normal gray and white matter signal elsewhere with no other abnormal. No midline shift, ventriculomegaly, extra-axial collection or acute intracranial  hemorrhage. Cervicomedullary junction and pituitary are within normal limits. No dural thickening. Vascular: Major intracranial vascular flow voids are preserved. The major dural venous sinuses are enhancing and appear patent. Skull and upper cervical spine: Negative visible cervical spine. Visualized bone marrow signal is within normal limits. Sinuses/Orbits: Orbit soft tissues appear symmetric and normal. Paranasal sinuses and mastoids are stable and well pneumatized. Other: Visible internal auditory structures appear normal. Scalp and face soft tissues appear negative. IMPRESSION: Solitary right middle frontal gyrus lesion measuring 2.7 cm involving gray and white matter and with indistinct petechial enhancement. 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 Other differential diagnoses such as ischemia and infectious encephalitis  were considered but felt unlikely. Electronically Signed   By: Genevie Ann M.D.   On: 03/15/2018 16:28    Procedures Procedures (including critical care time)  Medications Ordered in ED Medications  methylPREDNISolone sodium succinate (SOLU-MEDROL) 1,000 mg in sodium chloride 0.9 % 50 mL IVPB (0 mg Intravenous Paused 03/15/18 1801)  pantoprazole (PROTONIX) EC tablet 40 mg (has no administration in time range)  methylPREDNISolone sodium succinate (SOLU-MEDROL) 1,000 mg in sodium chloride 0.9 % 50 mL IVPB (has no administration in time range)  pantoprazole (PROTONIX) injection 40 mg (has no administration in time range)  enoxaparin (LOVENOX) injection 30 mg (has no administration in time range)  guaiFENesin (MUCINEX) 12 hr tablet 1,200 mg (has no administration in time range)  gadobutrol (GADAVIST) 1 MMOL/ML injection 10 mL (10 mLs Intravenous Contrast Given 03/15/18 1542)     Initial Impression / Assessment and Plan / ED Course  I have reviewed the triage vital signs  and the nursing notes.  Pertinent labs & imaging results that were available during my care of the patient were reviewed by me and considered in my medical decision making (see chart for details).     Patient presents for evaluation of dysarthria and left-sided facial droop since yesterday morning.  He is afebrile, tachycardic and hypertensive in the ED.  Head CT shows small to moderate amount of right frontoparietal edema which could be vasogenic.  MRI with and without contrast was recommended and obtained.  This showed findings consistent with multiple sclerosis.  EKG shows sinus tachycardia with no acute ischemic changes.  Remainder of lab work reviewed by me significant for mild hyperglycemia with glucose of 198.  Spoke with Dr. Leonel Ramsay with neurology who recommends 1000 mg IV Solu-Medrol and and hospitalist admission.  Talk with Dr. Steffanie Dunn with [facility] hospitalist service who agrees to assume care of patient and bring him into the hospital for further evaluation and management.  Final Clinical Impressions(s) / ED Diagnoses   Final diagnoses:  Multiple sclerosis [facility])    ED Discharge Orders    None       Debroah Baller 03/15/18 1843    Quintella Reichert, MD 03/16/18 1424

## 2018-03-15 NOTE — Telephone Encounter (Signed)
See note

## 2018-03-15 NOTE — Telephone Encounter (Signed)
Called with slurred speech and left sided facial droop that began occurring during the day yesterday. He is at work this morning, drove himself. Advised calling 911 immediately. Stated he would get to the hospital now.  Not a complete triaged performed. Immediate referral to [facility] for evaluation of possible stroke. Reason for Disposition . [1] Weakness (i.e., paralysis, loss of muscle strength) of the face, arm / hand, or leg / foot on one side of the body AND [2] sudden onset AND [3] present now  Answer Assessment - Initial Assessment Questions 1. SYMPTOM: "What is the main symptom you are concerned about?" (e.g., weakness, numbness)     Left sided facial drooping and slurred speach 2. ONSET: "When did this start?" (minutes, hours, days; while sleeping)     yesterday 3. LAST NORMAL: "When was the last time you were normal (no symptoms)?"     When he woke up yesterday 4. PATTERN "Does this come and go, or has it been constant since it started?"  "Is it present now?"     constant 5. CARDIAC SYMPTOMS: "Have you had any of the following symptoms: chest pain, difficulty breathing, palpitations?"     no 6. NEUROLOGIC SYMPTOMS: "Have you had any of the following symptoms: headache, dizziness, vision loss, double vision, changes in speech, unsteady on your feet?"     Slurred speach 7. OTHER SYMPTOMS: "Do you have any other symptoms?"      8. PREGNANCY: "Is there any chance you are pregnant?" "When was your last menstrual period?"     na  Protocols used: NEUROLOGIC DEFICIT-A-AH

## 2018-03-15 NOTE — Consult Note (Addendum)
Neurology Consultation  Reason for Consult: MS  Referring Physician: Ralene Bathe  CC: Dysarthria, facial droop  History is obtained from: Patient  HPI: James Bautista is a 37 y.o. male with history of DM2, kidney stones, hyperlipidemia, prostatitis.  Patient noted yesterday at approximately 9 AM that he was slurring his words in addition to having a water leak out of his mouth on the left side when he was drinking.  He thought this might be due to his sinus issues however upon waking this morning he noted that he was still dysarthric which made him nervous.  Patient came to the emergency room.  While in the emergency room he did obtain a CT of head which showed a small to moderate area of white matter edema in the subcortical right frontotemporal region.  A MRI was then obtained which showed a solitary right middle frontal gyrus lesion measuring 2.7 cm involving the gray and white matter with distinct petechial enhancement.  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 Currently patient is resting comfortably in his bed, continues to have slurred speech and left facial droop.  He states he has no other symptoms.  The patient states that he first noticed an abnormality involving the left side of his face when he started biting his left cheek occasionally when eating. He thought this was strange as it had never happened to him before, but not significant enough to feel worried about it. He narrows this first symptom down to October 12. He did not have any other symptoms until the onset of the above symptoms on 11/3. He notes that he received the flu vaccine at his PCP's office on October 1.   ROS: A 14 point ROS was performed and is negative except as noted in the HPI.   Past Medical History:  Diagnosis Date  . Allergy    seasonal  . Hyperlipemia   . Kidney stone    never passed a stone or had  radiographic evidence of stone.   . Prostatitis 2010   had to wear catheter for urinary retention. [facility] urologist.       Family History  Problem Relation Age of Onset  . Diabetes Father   . Hyperlipidemia Father   . Goiter Sister   . Cancer Paternal Aunt        ovarian cancer  . Cancer Maternal Grandmother        breast  . COPD Maternal Grandmother   . Heart disease Maternal Grandfather   . Dementia Maternal Grandfather      Social History:   reports that he has never smoked. He has never used smokeless tobacco. He reports that he does not drink alcohol or use drugs.  Medications  Current Facility-Administered Medications:  .  methylPREDNISolone sodium succinate (SOLU-MEDROL) 1,000 mg in sodium chloride 0.9 % 50 mL IVPB, 1,000 mg, Intravenous, Once, Fawze, Mina A, PA-C  Current Outpatient Medications:  .  aspirin EC 325 MG tablet, Take 325 mg by mouth once as needed (for headahces or pain)., Disp: , Rfl:  .  fexofenadine (ALLEGRA) 180 MG tablet, Take 180 mg by mouth daily as needed for allergies or rhinitis., Disp: , Rfl:  .  fluticasone (FLONASE) 50 MCG/ACT nasal spray, Place 2 sprays into both nostrils daily as needed for allergies or rhinitis. , Disp: , Rfl:  .  Guaifenesin (MUCUS RELIEF ER) 1200 MG TB12, Take 1,200 mg by  mouth daily as needed (for nasal drainage). , Disp: , Rfl:  .  metFORMIN (GLUCOPHAGE XR) 750 MG 24 hr tablet, Take 1 tablet (750 mg total) by mouth daily with breakfast., Disp: 90 tablet, Rfl: 3   Exam: Current vital signs: BP (!) 181/102   Pulse 93   Temp 98 F (36.7 C) (Oral)   Resp (!) 22   SpO2 97%  Vital signs in last 24 hours: Temp:  [98 F (36.7 C)] 98 F (36.7 C) (11/04 1140) Pulse Rate:  [93-113] 93 (11/04 1426) Resp:  [16-27] 22 (11/04 1426) BP: (154-184)/(102-116) 181/102 (11/04 1426) SpO2:  [95 %-97 %] 97 % (11/04 1426)  Physical Exam  Constitutional: Appears well-developed and well-nourished.  Psych: Affect appropriate to  situation Eyes: No scleral injection HENT: No OP obstrucion Head: Normocephalic.  Cardiovascular: Normal rate and regular rhythm.  Respiratory: Effort normal, non-labored breathing GI: Soft.  No distension. There is no tenderness.  Skin: WDI  Neuro: Mental Status: Patient is awake, alert, oriented to person, place, month, year, and situation. Patient is able to give a clear and coherent history. No signs of aphasia or neglect but does show mild dysarthria Cranial Nerves: II: Visual Fields are full. III,IV, VI: EOMI without ptosis or diploplia.  Pupils are equal, round, and reactive to light.  V: Facial sensation is symmetric to temperature VII: Left facial droop.  VIII: hearing is intact to voice X: Palate elevates symmetrically XI: Shoulder shrug is symmetric. XII: tongue is midline without atrophy or fasciculations.  Motor: Tone is normal. Bulk is normal. 5/5 strength was present in all four extremities with the exception of subtle weakness of pincer grasp and grip on the left, as well as subtle weakness of hip flexion on the left.  No pronator drift.   Sensory: Sensation is symmetric to light touch and temperature in the arms and legs. Deep Tendon Reflexes: 2+ and symmetric in the biceps and patellae.  Plantars: Toes are downgoing bilaterally.  Cerebellar: FNF and HKS are intact bilaterally   Labs I have reviewed labs in epic and the results pertinent to this consultation are:  CBC    Component Value Date/Time   WBC 8.0 03/15/2018 1206   RBC 6.33 (H) 03/15/2018 1206   HGB 16.7 03/15/2018 1231   HCT 49.0 03/15/2018 1231   PLT 190 03/15/2018 1206   MCV 78.2 (L) 03/15/2018 1206   MCH 25.3 (L) 03/15/2018 1206   MCHC 32.3 03/15/2018 1206   RDW 13.7 03/15/2018 1206   LYMPHSABS 2.0 03/15/2018 1206   MONOABS 0.4 03/15/2018 1206   EOSABS 0.1 03/15/2018 1206   BASOSABS 0.0 03/15/2018 1206    CMP     Component Value Date/Time   NA 138 03/15/2018 1231   K 4.1  03/15/2018 1231   CL 101 03/15/2018 1231   CO2 25 03/15/2018 1206   GLUCOSE 195 (H) 03/15/2018 1231   BUN 15 03/15/2018 1231   CREATININE 0.80 03/15/2018 1231   CALCIUM 9.2 03/15/2018 1206   PROT 7.4 03/15/2018 1206   ALBUMIN 4.1 03/15/2018 1206   AST 26 03/15/2018 1206   ALT 51 (H) 03/15/2018 1206   ALKPHOS 90 03/15/2018 1206   BILITOT 0.5 03/15/2018 1206   GFRNONAA >60 03/15/2018 1206   GFRAA >60 03/15/2018 1206    Lipid Panel     Component Value Date/Time   CHOL 136 12/29/2013 0803   TRIG 186.0 (H) 12/29/2013 0803   HDL 29.90 (L) 12/29/2013 0803   CHOLHDL 5 12/29/2013  0803   VLDL 37.2 12/29/2013 0803   LDLCALC 69 12/29/2013 0803   LDLDIRECT 67.2 10/13/2012 0904     Imaging I have reviewed the images obtained, as described in the HPI  Etta Quill PA-C [facility] [phone] 03/15/2018, 5:52 PM    Assessment:   37 year old male presenting with sore throat and left facial droop.  MRI reveals an enhancing lesion in the right frontal lobe appearing most consistent with acute demyelination.  1. 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 Less likely would be ADEM given no other lesions on brain MRI. Tumefactive demyelination, also a subtype of MS plaque is also on DDx. Lymphoma or oligodendroglioma are felt to be unlikely. PML is possible given the location, morphology and signal characteristics, therefore CSF analysis and HIV testing is warranted.  2. History of DM2  3. The patient has no history of MS. Also no history of MS in the family.  4. Recent flu vaccination on October 1 with onset of symptoms involving the left cheek/mouth on October 12, to the best of the patient's recollection. A vaccine-related episode of acute demyelination is therefore also on the DDx. If vaccine-related, then lifelong disease modifying therapy for MS may not be indicated, but periodic surveillance with MRI would be recommended.  5.  Disease pathophysiology and potential adverse effects of treatment described to patient and family. The patient expressed understanding and agreement with the plan. All questions answered.   Recommendations: - He has received 1 dose of Solu-Medrol and will need 4 more doses 1 g daily for the next 4 days along with Protonix 40 mg for gut protection -He will need to follow-up with Dr. Felecia Shelling from [facility], who is an expert in demyelinating disease, after discharge - SSI with close monitoring of glucose levels - PT/OT - MRI of cervical and thoracic spine without contrast. In 72 hours, would obtain follow up T1 post contrast images.  - Information for reporting a possible adverse event related to flu vaccination has been given to the patient (Federal Vaccine Adverse Events Reporting System, VAERS)  I have seen and examined the patient. I have formulated the assessment and plan above. I have documented additional findings from the neurological exam.  Electronically signed: Dr. Kerney Elbe

## 2018-03-15 NOTE — ED Triage Notes (Signed)
Pt to ER - slurred speech onset yesterday, left facial droop today, and numbness to left face. Pt a/o x4. No other deficits noted. Patient able to close both eyes and raise eye brows equally.

## 2018-03-15 NOTE — ED Notes (Signed)
Pt back from MRI 

## 2018-03-15 NOTE — H&P (Signed)
History and Physical    James Bautista MWN:[id number] DOB: 1981/01/27 DOA: 03/15/2018  PCP: Vivi Barrack, MD Consultants:  Neuro Patient coming from: home- lives with wife  Chief Complaint: Facial droop, slurred speech  HPI: James Bautista is a 37 y.o. male with medical history significant for obesity, HLD, T2DM on MTF, seasonal allergies, presented to ED with c/o L sided facial droop, slurred speech. He noticed about a week ago that when he ate he sometimes would bite the inside of his L cheek accidentally. Other than that he has been in his usual state of health until yesterday when he noticed he was slurring his speech. This morning he noticed he had a L facial droop. He denies f/c/s, rash, headache, weakness/numbness/tingling elsewhere, urinary/bowel symptoms, constipation/diarrhea. 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    ED Course: Head CT R frontoparietal vasogenic edema MRI: R middle frontal gyrus lesion 2.7 cm, felt consistent with MS Seen by neurology IV solumedrol ordered  Review of Systems: As per HPI; otherwise review of systems reviewed and negative.   Ambulatory Status:  Ambulates without assistance  Past Medical History:  Diagnosis Date  . Allergy    seasonal  . Hyperlipemia   . Kidney stone    never passed a stone or had radiographic evidence of stone.   . Prostatitis 2010   had to wear catheter for urinary retention. [facility] urologist.     Past Surgical History:  Procedure Laterality Date  . FEMUR FRACTURE SURGERY  '92   right leg.  [name] LASIK      Social History   Socioeconomic History  . Marital status: Married    Spouse name: Not on file  . Number of children: 0  . Years of education: 50  . Highest education level: Not on file  Occupational History  . Occupation: Chief Executive Officer  Social Needs  . Financial resource strain: Not on file  . Food insecurity:    Worry: Not on file   Inability: Not on file  . Transportation needs:    Medical: Not on file    Non-medical: Not on file  Tobacco Use  . Smoking status: Never Smoker  . Smokeless tobacco: Never Used  Substance and Sexual Activity  . Alcohol use: No    Alcohol/week: 1.0 standard drinks    Types: 1 Standard drinks or equivalent per week  . Drug use: No  . Sexual activity: Yes    Partners: Female  Lifestyle  . Physical activity:    Days per week: Not on file    Minutes per session: Not on file  . Stress: Not on file  Relationships  . Social connections:    Talks on phone: Not on file    Gets together: Not on file    Attends religious service: Not on file    Active member of club or organization: Not on file    Attends meetings of clubs or organizations: Not on file    Relationship status: Not on file  . Intimate partner violence:    Fear of current or ex partner: Not on file    Emotionally abused: Not on file    Physically abused: Not on file    Forced sexual activity: Not on file  Other Topics Concern  . Not on file  Social History Narrative   [place] - [place], [employer] 2010. Married. Uses condoms. Work -  [employer]. 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    Allergies  Allergen Reactions  . Neomycin Swelling and Other (See Comments)    Neomycin ear drops - ears swelled shut  . Banana Rash and Other (See Comments)    "Tastes like eating a porcupine"  . Nickel Rash  . Peach [Prunus Persica] Rash and Other (See Comments)    "Tastes like eating a porcupine"    Family History  Problem Relation Age of Onset  . Diabetes Father   . Hyperlipidemia Father   . Goiter Sister   . Cancer Paternal Aunt        ovarian cancer  . Cancer Maternal Grandmother        breast  . COPD Maternal Grandmother   . Heart disease Maternal Grandfather   . Dementia Maternal Grandfather     Prior to Admission medications   Medication Sig Start Date End Date Taking? Authorizing Provider  fexofenadine  (ALLEGRA) 180 MG tablet Take 180 mg by mouth daily as needed for allergies or rhinitis.   Yes [provider]  fluticasone (FLONASE) 50 MCG/ACT nasal spray Place 2 sprays into both nostrils daily as needed for allergies or rhinitis.    Yes [provider]  metFORMIN (GLUCOPHAGE XR) 750 MG 24 hr tablet Take 1 tablet (750 mg total) by mouth daily with breakfast. 02/09/18  Yes Vivi Barrack, MD    Physical Exam: Vitals:   03/15/18 1140 03/15/18 1248 03/15/18 1347 03/15/18 1426  BP: (!) 184/116 (!) 154/109  (!) 181/102  Pulse: (!) 113 (!) 101 96 93  Resp: 16 (!) 27 (!) 24 (!) 22  Temp: 98 F (36.7 C)     TempSrc: Oral     SpO2: 95% 96% 96% 97%     . General:  Appears calm and comfortable and is in NAD. Slight facial flushing bilaterally . Eyes:  PERRL, EOMI, normal lids, iris . ENT:  grossly normal hearing, lips & tongue, mmm; appropriate dentition . Neck:  no LAD, masses or thyromegaly; no carotid bruits . Cardiovascular:  tachy rate, reg rhythm, no murmur. [name] Respiratory:   CTA bilaterally with no wheezes/rales/rhonchi.  Normal respiratory effort. . Abdomen:  soft, NT, ND, NABS . Back:   grossly normal alignment, no CVAT . Skin:  no rash or induration seen on limited exam . Musculoskeletal:  grossly normal tone BUE/BLE, good ROM, no bony abnormality or obvious joint deformity . Lower extremity:  No LE edema.  Limited foot exam with no ulcerations.  2+ distal pulses. [name] Psychiatric:  grossly normal mood and affect, speech fluent and appropriate, AOx3 . Neurologic: L facial droop is present. Mild dysarthria, worsens with continued speaking. Otherwise CN 2-12 grossly intact, moves all extremities in coordinated fashion, sensation and strength intact. Normal FNF, RAM testing. Reflexes 2+ throughout    Radiological Exams on Admission: Ct Head Wo Contrast  Result Date: 03/15/2018 CLINICAL DATA:  37 year old male with acute LEFT facial droop and slurred speech for 1 day.  EXAM: CT HEAD WITHOUT CONTRAST TECHNIQUE: Contiguous axial images were obtained from the base of the skull through the vertex without intravenous contrast. COMPARISON:  None. FINDINGS: Brain: A small to moderate area of white matter edema in the subcortical RIGHT frontoparietal region is identified. The overlying cortex is not appear to be involved and likely represents vasogenic edema. No hemorrhage, midline shift, hydrocephalus or extra-axial collection. No definite venous thrombosis identified. Vascular: No hyperdense vessel or unexpected calcification. Skull: Normal. Negative for  fracture or focal lesion. Sinuses/Orbits: No acute finding. Other: None. IMPRESSION: 1. Small to moderate amount of RIGHT frontoparietal edema which appears to be vasogenic. MRI with and without contrast recommended for further evaluation. Electronically Signed   By: Margarette Canada M.D.   On: 03/15/2018 12:46   Mr Jeri Cos And Wo Contrast  Result Date: 03/15/2018 CLINICAL DATA:  37 year old male with abrupt onset left facial droop and slurred speech. Abnormal noncontrast head CT earlier suggesting vasogenic edema in the right hemisphere. EXAM: MRI HEAD WITHOUT AND WITH CONTRAST TECHNIQUE: Multiplanar, multiecho pulse sequences of the brain and surrounding structures were obtained without and with intravenous contrast. CONTRAST:  10 milliliters Gadavist COMPARISON:  Head CT without contrast 1233 hours today. FINDINGS: Brain: In the right middle frontal gyrus there is a 3 centimeter oval area of abnormal gray and white matter signal. There is moderate to intense increased trace diffusion signal throughout the lesion, but areas of both facilitated (centrally) and restricted (medial margin) diffusion. These areas have corresponding high (central) and intermediate T2 and FLAIR hyperintensity. Cortex and regional white matter are affected as seen on series 11, image 14. Following contrast there is indistinct petechial enhancement which is  best demonstrated on series 13, image 14. All told, the lesion encompasses 26 x 27 x 20 millimeters (AP by transverse by CC). There is a paucity of regional mass effect, and no larger area of surrounding edema. No 2nd lesion or other area of abnormal enhancement or signal abnormality is identified. No hemosiderin or abnormal mineralization. Normal gray and white matter signal elsewhere with no other abnormal. No midline shift, ventriculomegaly, extra-axial collection or acute intracranial hemorrhage. Cervicomedullary junction and pituitary are within normal limits. No dural thickening. Vascular: Major intracranial vascular flow voids are preserved. The major dural venous sinuses are enhancing and appear patent. Skull and upper cervical spine: Negative visible cervical spine. Visualized bone marrow signal is within normal limits. Sinuses/Orbits: Orbit soft tissues appear symmetric and normal. Paranasal sinuses and mastoids are stable and well pneumatized. Other: Visible internal auditory structures appear normal. Scalp and face soft tissues appear negative. IMPRESSION: Solitary right middle frontal gyrus lesion measuring 2.7 cm involving gray and white matter and with indistinct petechial enhancement. 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 Other differential diagnoses such as ischemia and infectious encephalitis were considered but felt unlikely. Electronically Signed   By: Genevie Ann M.D.   On: 03/15/2018 16:28    EKG: Independently reviewed.  ST with rate 117   Labs on Admission: I have personally reviewed the available labs and imaging studies at the time of the admission.  Pertinent labs:  Gluc 198 Creat 1.00 --> 0.8 ALT 51 TnI 0 WBC 8 Hgb 16 Plt 190   Assessment/Plan Principal Problem:   Brain lesion Active Problems:   Type 2 diabetes mellitus (HCC)   Obesity   Seasonal  allergies   Multiple sclerosis (HCC)   Sinus tachycardia   L facial droop and slurred speech, with MRI showing R frontal gyrus lesion: radiographically consistent with MS. Neuro has evaluated pt and started solumedrol. -Cont solumedrol for planned 1g/day x 5 days -pt has asked if he might possibly be discharged prior to end of 5 days and come in daily for his solumedrol until course is complete; will defer to neuro. Will need to be monitored at least initially for hyperglycemia and other potential complications -cont PPI  for GI protection given high dose steroids -glucose monitoring, SSI  -appreciate neuro assistance -have provided pt with copies of patient education handout re: MS from UTD  Sinus tachycardia: felt likely 2/2 mild hypovolemia (pt has been NPO all afternoon) as well as anxiety from his new diagnosis. Hgb is 16 suggesting possible hemoconcentration. -NS at 100 cc/hour -monitor  T2DM: -hold MTF -SSI -will likely need insulin while on high dose steroids  Seasonal allergies -mucinex prn   DVT prophylaxis:  Lovenox  Code Status:  Full - confirmed with patient/family Family Communication: wife at bedside  Disposition Plan:  Home once clinically improved Consults called: Neuro  Admission status: Admit - It is my clinical opinion that admission to INPATIENT is reasonable and necessary because of the expectation that this patient will require hospital care that crosses at least 2 midnights to treat this condition based on the medical complexity of the problems presented.  Given the aforementioned information, the predictability of an adverse outcome is felt to be significant.     Janora Norlander MD [facility]  If note is complete, please contact covering daytime or nighttime physician. [email or web address] Password [email or web address]  03/15/2018, 5:01 PM

## 2018-03-15 NOTE — ED Notes (Signed)
Patient transported to MRI 

## 2018-03-15 NOTE — Telephone Encounter (Signed)
Noted  

## 2018-03-16 ENCOUNTER — Encounter (HOSPITAL_COMMUNITY): Payer: Self-pay

## 2018-03-16 ENCOUNTER — Inpatient Hospital Stay (HOSPITAL_COMMUNITY): Payer: 59

## 2018-03-16 LAB — GLUCOSE, CAPILLARY
Glucose-Capillary: 276 mg/dL — ABNORMAL HIGH (ref 70–99)
Glucose-Capillary: 244 mg/dL — ABNORMAL HIGH (ref 70–99)
Glucose-Capillary: 305 mg/dL — ABNORMAL HIGH (ref 70–99)
Glucose-Capillary: 295 mg/dL — ABNORMAL HIGH (ref 70–99)

## 2018-03-16 LAB — CBC
HCT: 49.8 % (ref 39.0–52.0)
Hemoglobin: 15.8 g/dL (ref 13.0–17.0)
MCH: 24.2 pg — ABNORMAL LOW (ref 26.0–34.0)
MCHC: 31.7 g/dL (ref 30.0–36.0)
MCV: 76.4 fL — ABNORMAL LOW (ref 80.0–100.0)
Platelets: 213 10*3/uL (ref 150–400)
RBC: 6.52 MIL/uL — ABNORMAL HIGH (ref 4.22–5.81)
RDW: 14.2 % (ref 11.5–15.5)
WBC: 8 10*3/uL (ref 4.0–10.5)
nRBC: 0 % (ref 0.0–0.2)

## 2018-03-16 LAB — BASIC METABOLIC PANEL
Anion gap: 9 (ref 5–15)
BUN: 13 mg/dL (ref 6–20)
CO2: 22 mmol/L (ref 22–32)
Calcium: 9.1 mg/dL (ref 8.9–10.3)
Chloride: 103 mmol/L (ref 98–111)
Creatinine, Ser: 1.05 mg/dL (ref 0.61–1.24)
GFR calc Af Amer: >60 mL/min (ref >60)
GFR calc non Af Amer: >60 mL/min (ref >60)
Glucose, Bld: 329 mg/dL — ABNORMAL HIGH (ref 70–99)
Potassium: 4.4 mmol/L (ref 3.5–5.1)
Sodium: 134 mmol/L — ABNORMAL LOW (ref 135–145)

## 2018-03-16 MED ORDER — INSULIN GLARGINE 100 UNIT/ML ~~LOC~~ SOLN
15.0000 [IU] | Freq: Every day | SUBCUTANEOUS | Status: DC
  Administered 2018-03-16: 15 [IU] via SUBCUTANEOUS
  Filled 2018-03-16 (×2): qty 0.15

## 2018-03-16 MED ORDER — ENOXAPARIN SODIUM 40 MG/0.4ML ~~LOC~~ SOLN
40.0000 mg | SUBCUTANEOUS | Status: DC
  Administered 2018-03-16: 40 mg via SUBCUTANEOUS
  Filled 2018-03-16: qty 0.4

## 2018-03-16 NOTE — Progress Notes (Signed)
Inpatient Diabetes Program Recommendations  AACE/ADA: New Consensus Statement on Inpatient Glycemic Control (2015)  Target Ranges:  Prepandial:   less than 140 mg/dL      Peak postprandial:   less than 180 mg/dL (1-2 hours)      Critically ill patients:  140 - 180 mg/dL   Results for RENN, DIROCCO (MRN [id number]) as of 03/16/2018 10:58  Ref. Range 03/15/2018 21:23 03/16/2018 06:28  Glucose-Capillary Latest Ref Range: 70 - 99 mg/dL 183 (H) 276 (H)   Review of Glycemic Control  Diabetes history: DM 2 Outpatient Diabetes medications: Metformin 750 mg Daily Current orders for Inpatient glycemic control: Novolog 0-15 units tid, Novolog 0-5 units qhs  A1c 8.6% on 10/1  Inpatient Diabetes Program Recommendations:    Fasting glucose 276 mg/dl, prior to IV Solumedrol 1000 mg dose. Consider adding Lantus 15-20 units Q24 hours. May also consider meal coverage if postprandial glucose trends increase significantly.  Thanks,  Tama Headings RN, MSN, BC-ADM Inpatient Diabetes Coordinator Team Pager [phone] (8a-5p)

## 2018-03-16 NOTE — Progress Notes (Addendum)
Subjective: Feels like things are improving  Exam: Vitals:   03/16/18 1145 03/16/18 1547  BP: (!) 163/85 (!) 145/88  Pulse: (!) 101 (!) 106  Resp: 20 20  Temp: 98.2 F (36.8 C) 98.8 F (37.1 C)  SpO2: 95% 96%   Gen: In bed, NAD Resp: non-labored breathing, no acute distress Abd: soft, nt  Neuro: MS: Awake, alert, interactive and appropriate CN: Mild left facial droop Motor: No drift, possible mild left grip weakness Sensory: Intact light touch  Impression: 37 year old male with clinically isolated syndrome.  Correlation in time to the vaccine could suggest something akin to a very mild form of postvaccination ADEM as opposed to MS which would necessitate lifelong therapy.  A significant pleocytosis would be supportive of this versus MS, though not conclusive.  I do think an LP in the acute setting would be prudent so that this information is available when making long-term decisions.  In the short-term I would favor completing a 5-day course of steroids, and given his diabetes he would likely be safer for this to be done as an inpatient.  Unfortunately he received Lovenox already today and therefore no LP will be performed today, but we will plan to do this tomorrow.  Recommendations: 1) continue IV Solu-Medrol x5 days 2) LP for tomorrow, cells, glucose, protein, oligoclonal bands, IgG index, cytology  Roland Rack, MD [facility] [phone]  If 7pm- 7am, please page neurology on call as listed in [place].

## 2018-03-16 NOTE — Progress Notes (Signed)
PROGRESS NOTE    James Bautista  EUM:[id number] DOB: 1980/07/10 DOA: 03/15/2018 PCP: Vivi Barrack, MD  Brief Narrative: James Bautista is a 37 y.o. male with medical history significant for obesity, HLD, T2DM on MTF, seasonal allergies, presented to ED with c/o L sided facial droop, slurred speech. He noticed about a week ago that when he ate would bite the inside of his L cheek accidentally. Other than that he has been in his usual state of health until 11/3 when he noticed he was slurring his speech, then 11/4 morning he noticed he had a L facial droop.   Assessment & Plan:   Dysarthria, left facial droop -MRI brain noted enhancing lesion in the right frontal lobe, consistent with acute demyelination -Differential diagnosis includes Balo's concentric sclerosis, a rare subtype of MS lesion, vs Flu vaccine related demyelination which would also be very unusual -Neurology following, started on high-dose IV Solu-Medrol, patient feels about 25 to 30% improvement -Continue PPI for GI prophylaxis -MRI C and Tspine done will follow-up -HIV antibody serology pending  Type 2 diabetes mellitus with hyperglycemia -In the setting of high dose IV steroids -Add Lantus, sliding scale insulin  Obesity  Dehydration/hemoconcentration -resolved, stop IV fluids  DVT prophylaxis: add lovenox Code Status: FUll Code Family Communication: Wife at bedside Disposition Plan: Home per neurology recommendations  Consultants:   Neurology   Procedures:   Antimicrobials:    Subjective: -Feels a little better, speech is starting to improve, facial droop also better compared to yesterday  Objective: Vitals:   03/15/18 1426 03/15/18 1730 03/16/18 0205 03/16/18 0424  BP: (!) 181/102 (!) 155/106 (!) 154/107 (!) 136/91  Pulse: 93 98 99 90  Resp: (!) 22  18 20   Temp:   97.6 F (36.4 C) 97.6 F (36.4 C)  TempSrc:   Oral Oral  SpO2: 97% 95% 97% 97%    Intake/Output Summary (Last 24 hours) at  03/16/2018 1141 Last data filed at 03/16/2018 0400 Gross per 24 hour  Intake 824.62 ml  Output -  Net 824.62 ml   There were no vitals filed for this visit.  Examination:  General exam: Appears calm and comfortable, no distress Respiratory system: CTAB Cardiovascular system: S1 & S2 heard, RRR. Gastrointestinal system: Abdomen is nondistended, soft and nontender.Normal bowel sounds heard. Central nervous system: Alert and oriented, mild left facial droop, mild dysarthria, motor 5 x 5, sensations light touch intact bilaterally Extremities: No edema Skin: No rashes, lesions or ulcers Psychiatry: Judgement and insight appear normal. Mood & affect appropriate.     Data Reviewed:   CBC: Recent Labs  Lab 03/15/18 1206 03/15/18 1231 03/16/18 0354  WBC 8.0  --  8.0  NEUTROABS 5.3  --   --   HGB 16.0 16.7 15.8  HCT 49.5 49.0 49.8  MCV 78.2*  --  76.4*  PLT 190  --  540   Basic Metabolic Panel: Recent Labs  Lab 03/15/18 1206 03/15/18 1231 03/16/18 0354  NA 135 138 134*  K 4.1 4.1 4.4  CL 104 101 103  CO2 25  --  22  GLUCOSE 198* 195* 329*  BUN 12 15 13   CREATININE 1.00 0.80 1.05  CALCIUM 9.2  --  9.1   GFR: Estimated Creatinine Clearance: 124.1 mL/min (by C-G formula based on SCr of 1.05 mg/dL). Liver Function Tests: Recent Labs  Lab 03/15/18 1206  AST 26  ALT 51*  ALKPHOS 90  BILITOT 0.5  PROT 7.4  ALBUMIN 4.1  No results for input(s): LIPASE, AMYLASE in the last 168 hours. No results for input(s): AMMONIA in the last 168 hours. Coagulation Profile: Recent Labs  Lab 03/15/18 1206  INR 1.03   Cardiac Enzymes: No results for input(s): CKTOTAL, CKMB, CKMBINDEX, TROPONINI in the last 168 hours. BNP (last 3 results) No results for input(s): PROBNP in the last 8760 hours. HbA1C: No results for input(s): HGBA1C in the last 72 hours. CBG: Recent Labs  Lab 03/15/18 1251 03/15/18 2123 03/16/18 0628  GLUCAP 183* 183* 276*   Lipid Profile: No results  for input(s): CHOL, HDL, LDLCALC, TRIG, CHOLHDL, LDLDIRECT in the last 72 hours. Thyroid Function Tests: No results for input(s): TSH, T4TOTAL, FREET4, T3FREE, THYROIDAB in the last 72 hours. Anemia Panel: No results for input(s): VITAMINB12, FOLATE, FERRITIN, TIBC, IRON, RETICCTPCT in the last 72 hours. Urine analysis:    Component Value Date/Time   COLORURINE YELLOW 10/04/2017 1530   APPEARANCEUR CLEAR 10/04/2017 1530   LABSPEC 1.024 10/04/2017 1530   PHURINE 5.0 10/04/2017 1530   GLUCOSEU >=500 (A) 10/04/2017 1530   HGBUR NEGATIVE 10/04/2017 1530   BILIRUBINUR NEGATIVE 10/04/2017 1530   KETONESUR 5 (A) 10/04/2017 1530   PROTEINUR 30 (A) 10/04/2017 1530   UROBILINOGEN 1.0 12/04/2010 1600   NITRITE NEGATIVE 10/04/2017 1530   LEUKOCYTESUR NEGATIVE 10/04/2017 1530   Sepsis Labs: @LABRCNTIP (procalcitonin:4,lacticidven:4)  )No results found for this or any previous visit (from the past 240 hour(s)).       Radiology Studies: Ct Head Wo Contrast  Result Date: 03/15/2018 CLINICAL DATA:  37 year old male with acute LEFT facial droop and slurred speech for 1 day. EXAM: CT HEAD WITHOUT CONTRAST TECHNIQUE: Contiguous axial images were obtained from the base of the skull through the vertex without intravenous contrast. COMPARISON:  None. FINDINGS: Brain: A small to moderate area of white matter edema in the subcortical RIGHT frontoparietal region is identified. The overlying cortex is not appear to be involved and likely represents vasogenic edema. No hemorrhage, midline shift, hydrocephalus or extra-axial collection. No definite venous thrombosis identified. Vascular: No hyperdense vessel or unexpected calcification. Skull: Normal. Negative for fracture or focal lesion. Sinuses/Orbits: No acute finding. Other: None. IMPRESSION: 1. Small to moderate amount of RIGHT frontoparietal edema which appears to be vasogenic. MRI with and without contrast recommended for further evaluation.  Electronically Signed   By: Margarette Canada M.D.   On: 03/15/2018 12:46   Mr Jeri Cos And Wo Contrast  Result Date: 03/15/2018 CLINICAL DATA:  37 year old male with abrupt onset left facial droop and slurred speech. Abnormal noncontrast head CT earlier suggesting vasogenic edema in the right hemisphere. EXAM: MRI HEAD WITHOUT AND WITH CONTRAST TECHNIQUE: Multiplanar, multiecho pulse sequences of the brain and surrounding structures were obtained without and with intravenous contrast. CONTRAST:  10 milliliters Gadavist COMPARISON:  Head CT without contrast 1233 hours today. FINDINGS: Brain: In the right middle frontal gyrus there is a 3 centimeter oval area of abnormal gray and white matter signal. There is moderate to intense increased trace diffusion signal throughout the lesion, but areas of both facilitated (centrally) and restricted (medial margin) diffusion. These areas have corresponding high (central) and intermediate T2 and FLAIR hyperintensity. Cortex and regional white matter are affected as seen on series 11, image 14. Following contrast there is indistinct petechial enhancement which is best demonstrated on series 13, image 14. All told, the lesion encompasses 26 x 27 x 20 millimeters (AP by transverse by CC). There is a paucity of regional mass effect, and  no larger area of surrounding edema. No 2nd lesion or other area of abnormal enhancement or signal abnormality is identified. No hemosiderin or abnormal mineralization. Normal gray and white matter signal elsewhere with no other abnormal. No midline shift, ventriculomegaly, extra-axial collection or acute intracranial hemorrhage. Cervicomedullary junction and pituitary are within normal limits. No dural thickening. Vascular: Major intracranial vascular flow voids are preserved. The major dural venous sinuses are enhancing and appear patent. Skull and upper cervical spine: Negative visible cervical spine. Visualized bone marrow signal is within normal  limits. Sinuses/Orbits: Orbit soft tissues appear symmetric and normal. Paranasal sinuses and mastoids are stable and well pneumatized. Other: Visible internal auditory structures appear normal. Scalp and face soft tissues appear negative. IMPRESSION: Solitary right middle frontal gyrus lesion measuring 2.7 cm involving gray and white matter and with indistinct petechial enhancement. 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 Other differential diagnoses such as ischemia and infectious encephalitis were considered but felt unlikely. Electronically Signed   By: Genevie Ann M.D.   On: 03/15/2018 16:28   Mr Cervical Spine Wo Contrast  Result Date: 03/16/2018 CLINICAL DATA:  Left facial droop and dysarthria for approximately 1 week. Abnormal brain MRI with findings worrisome for multiple sclerosis. EXAM: MRI CERVICAL AND THORACIC SPINE WITHOUT CONTRAST TECHNIQUE: Multiplanar and multiecho pulse sequences of the cervical spine, to include the craniocervical junction and cervicothoracic junction, and the thoracic spine, were obtained without intravenous contrast. COMPARISON:  MRI brain 03/15/2018. FINDINGS: MRI CERVICAL SPINE FINDINGS Alignment: Maintained with mild reversal of lordosis noted. Vertebrae: No fracture or worrisome lesion. Cord: Normal signal throughout. Posterior Fossa, vertebral arteries, paraspinal tissues: Negative. Disc levels: C2-3: Mild foraminal narrowing on the right due to uncovertebral disease. The central canal and left foramen are widely patent. C3-4: Very shallow disc bulge to the right and mild uncovertebral disease on the right. The central canal and left foramen are widely patent. Mild right foraminal narrowing noted. C4-5: Minimal bulge without stenosis. C6-7: Shallow disc bulge and mild uncovertebral disease. The central canal is open. Mild foraminal narrowing is more notable on  the right. C6-7: Negative. C7-T1: Negative. MRI THORACIC SPINE FINDINGS Alignment:  Normal. Vertebrae: No fracture or worrisome lesion. Tiny hemangioma in T7 incidentally noted. Cord:  Normal signal throughout. Paraspinal and other soft tissues: Normal. Disc levels: Disc height and hydration are maintained at all levels. No bulge or protrusion. The central canal and foramina are widely patent throughout. IMPRESSION: Normal-appearing cervical and thoracic cord. Negative for demyelinating disease. Cervical spondylosis results in mild right foraminal narrowing at C2-3 and C3-4 and mild bilateral foraminal narrowing at C6-7. The central canal is open at all levels. Normal thoracic spine MRI. Electronically Signed   By: Inge Rise M.D.   On: 03/16/2018 09:58   Mr Thoracic Spine Wo Contrast  Result Date: 03/16/2018 CLINICAL DATA:  Left facial droop and dysarthria for approximately 1 week. Abnormal brain MRI with findings worrisome for multiple sclerosis. EXAM: MRI CERVICAL AND THORACIC SPINE WITHOUT CONTRAST TECHNIQUE: Multiplanar and multiecho pulse sequences of the cervical spine, to include the craniocervical junction and cervicothoracic junction, and the thoracic spine, were obtained without intravenous contrast. COMPARISON:  MRI brain 03/15/2018. FINDINGS: MRI CERVICAL SPINE FINDINGS Alignment: Maintained with mild reversal of lordosis noted. Vertebrae: No fracture or worrisome lesion. Cord: Normal signal throughout. Posterior Fossa, vertebral arteries, paraspinal tissues: Negative. Disc levels: C2-3: Mild foraminal narrowing on the  right due to uncovertebral disease. The central canal and left foramen are widely patent. C3-4: Very shallow disc bulge to the right and mild uncovertebral disease on the right. The central canal and left foramen are widely patent. Mild right foraminal narrowing noted. C4-5: Minimal bulge without stenosis. C6-7: Shallow disc bulge and mild uncovertebral disease. The central  canal is open. Mild foraminal narrowing is more notable on the right. C6-7: Negative. C7-T1: Negative. MRI THORACIC SPINE FINDINGS Alignment:  Normal. Vertebrae: No fracture or worrisome lesion. Tiny hemangioma in T7 incidentally noted. Cord:  Normal signal throughout. Paraspinal and other soft tissues: Normal. Disc levels: Disc height and hydration are maintained at all levels. No bulge or protrusion. The central canal and foramina are widely patent throughout. IMPRESSION: Normal-appearing cervical and thoracic cord. Negative for demyelinating disease. Cervical spondylosis results in mild right foraminal narrowing at C2-3 and C3-4 and mild bilateral foraminal narrowing at C6-7. The central canal is open at all levels. Normal thoracic spine MRI. Electronically Signed   By: Inge Rise M.D.   On: 03/16/2018 09:58        Scheduled Meds: . insulin aspart  0-15 Units Subcutaneous TID WC  . insulin aspart  0-5 Units Subcutaneous QHS  . insulin glargine  15 Units Subcutaneous Daily  . pantoprazole  40 mg Oral Q0600   Continuous Infusions: . methylPREDNISolone (SOLU-MEDROL) injection Stopped (03/15/18 2125)  . methylPREDNISolone (SOLU-MEDROL) injection 1,000 mg (03/16/18 1036)     LOS: 1 day    Time spent: 30min    Domenic Polite, MD [facility] Page via [email or web address], password [email or web address] After 7PM please contact night-coverage  03/16/2018, 11:41 AM

## 2018-03-16 NOTE — Progress Notes (Signed)
Pt arrived from the ED to 3w10. Wife at bedside. Call bell in reach, bed locked, and oriented to room. Pt ambulated to the bathroom. Right foot was slightly inverted. Asked Pt if he was aware he said no. While in bed right foot was upright. Decreased sensation to the left side. BP still elevated. No pain, numbness, or tingling.

## 2018-03-17 ENCOUNTER — Inpatient Hospital Stay (HOSPITAL_COMMUNITY): Payer: 59

## 2018-03-17 LAB — CSF CELL COUNT WITH DIFFERENTIAL
RBC Count, CSF: 7 /mm3 — ABNORMAL HIGH
Tube #: 1
WBC CSF: 3 /mm3 (ref 0–5)

## 2018-03-17 LAB — GLUCOSE, CAPILLARY
Glucose-Capillary: 231 mg/dL — ABNORMAL HIGH (ref 70–99)
Glucose-Capillary: 233 mg/dL — ABNORMAL HIGH (ref 70–99)
Glucose-Capillary: 245 mg/dL — ABNORMAL HIGH (ref 70–99)
Glucose-Capillary: 250 mg/dL — ABNORMAL HIGH (ref 70–99)

## 2018-03-17 LAB — PROTEIN AND GLUCOSE, CSF
Glucose, CSF: 151 mg/dL — ABNORMAL HIGH (ref 40–70)
Total  Protein, CSF: 54 mg/dL — ABNORMAL HIGH (ref 15–45)

## 2018-03-17 MED ORDER — INSULIN ASPART 100 UNIT/ML ~~LOC~~ SOLN
5.0000 [IU] | Freq: Three times a day (TID) | SUBCUTANEOUS | Status: DC
  Administered 2018-03-17 – 2018-03-18 (×4): 5 [IU] via SUBCUTANEOUS

## 2018-03-17 MED ORDER — LIDOCAINE HCL (PF) 1 % IJ SOLN
5.0000 mL | Freq: Once | INTRAMUSCULAR | Status: AC
  Administered 2018-03-17: 5 mL via INTRADERMAL

## 2018-03-17 MED ORDER — LIDOCAINE HCL (PF) 1 % IJ SOLN
INTRAMUSCULAR | Status: AC
  Filled 2018-03-17: qty 5

## 2018-03-17 MED ORDER — LIDOCAINE HCL (PF) 1 % IJ SOLN
INTRAMUSCULAR | Status: AC
  Administered 2018-03-17: 5 mL via INTRADERMAL
  Filled 2018-03-17: qty 5

## 2018-03-17 MED ORDER — INSULIN GLARGINE 100 UNIT/ML ~~LOC~~ SOLN
25.0000 [IU] | Freq: Every day | SUBCUTANEOUS | Status: DC
  Administered 2018-03-17 – 2018-03-19 (×3): 25 [IU] via SUBCUTANEOUS
  Filled 2018-03-17 (×4): qty 0.25

## 2018-03-17 MED ORDER — LIDOCAINE HCL (CARDIAC) PF 100 MG/5ML IV SOSY
PREFILLED_SYRINGE | INTRAVENOUS | Status: AC
  Filled 2018-03-17: qty 5

## 2018-03-17 NOTE — Procedures (Signed)
Indication: evaluate for multiple sclerosis  Risks of the procedure were dicussed with the patient including post-LP headache, bleeding, infection, weakness/numbness of legs(radiculopathy), death.  The patien proxy agreed and written consent was obtained.   Could not feel any land marks thus due to patients safety placed LP to be done under fluoroscopy.   Etta Quill PA-C [facility] [phone]  M-F  (9:00 am- 5:00 PM)  03/17/2018, 10:12 AM

## 2018-03-17 NOTE — Progress Notes (Addendum)
PROGRESS NOTE    James Bautista  EML:[id number] DOB: 1980/07/13 DOA: 03/15/2018 PCP: Vivi Barrack, MD  Brief Narrative: James Bautista is a 37 y.o. male with medical history significant for obesity, HLD, T2DM on MTF, seasonal allergies, presented to ED with c/o L sided facial droop, slurred speech. He noticed about a week ago that when he ate would bite the inside of his L cheek accidentally. Other than that he has been in his usual state of health until 11/3 when he noticed he was slurring his speech, then 11/4 morning he noticed he had a L facial droop. 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 -Started high-dose IV steroids 11/4, for spinal tap today  Assessment & Plan:   Dysarthria, left facial droop -MRI brain noted enhancing lesion in the right frontal lobe, consistent with acute demyelination -Differential diagnosis includes Balo's concentric sclerosis, a rare subtype of MS lesion, vs Flu vaccine related demyelination  -Neurology following, started on high-dose IV Solu-Medrol day3 now, continues to have good clinical improvement -for LP in fluoro today, FU cell counts, oligoclonal bands -Continue PPI for GI prophylaxis -MRI C and Tspine were unremarkable -HIV antibody serology pending  Type 2 diabetes mellitus with hyperglycemia -In the setting of high dose IV steroids -Hb A1c was 8.6 prior to admission -Started Lantus and meal coverage NovoLog, sliding scale insulin while he is on IV steroids, will not need insulin at discharge, assuming he will be off steroids then  Obesity  Dehydration/hemoconcentration -resolved, stop IV fluids  DVT prophylaxis: Lovenox stopped for spinal tap Code Status: FUll Code Family Communication: Wife at bedside Disposition Plan: Home per neurology recommendations  Consultants:    Neurology   Procedures:   Antimicrobials:    Subjective: -Continues to feel better, speech and facial droop improving  Objective: Vitals:   03/16/18 2109 03/16/18 2344 03/17/18 0427 03/17/18 0753  BP: (!) 160/98 (!) 148/83 131/67 133/72  Pulse: (!) 102 92 90 86  Resp: 20 18 18 18   Temp: 98 F (36.7 C)  97.9 F (36.6 C) 98.2 F (36.8 C)  TempSrc: Oral  Oral Oral  SpO2: 98% 97% 98% 98%    Intake/Output Summary (Last 24 hours) at 03/17/2018 0955 Last data filed at 03/16/2018 2109 Gross per 24 hour  Intake 553.2 ml  Output -  Net 553.2 ml   There were no vitals filed for this visit.  Examination:  Gen: Awake, Alert, Oriented X 3, no distress HEENT: Left facial droop is much better Lungs: Good air movement bilaterally, CTAB CVS: RRR,No Gallops,Rubs or new Murmurs Abd: soft, Non tender, non distended, BS present Extremities: No Cyanosis, Clubbing or edema Skin: no new rashes Central nervous system: Alert and oriented, minimal left facial droop, dysarthria-improving, motor 5 x 5, sensations light touch intact bilaterally    Data Reviewed:   CBC: Recent Labs  Lab 03/15/18 1206 03/15/18 1231 03/16/18 0354  WBC 8.0  --  8.0  NEUTROABS 5.3  --   --   HGB 16.0 16.7 15.8  HCT 49.5 49.0 49.8  MCV 78.2*  --  76.4*  PLT 190  --  712   Basic Metabolic Panel: Recent Labs  Lab 03/15/18 1206 03/15/18 1231 03/16/18 0354  NA 135 138 134*  K 4.1 4.1 4.4  CL 104 101 103  CO2 25  --  22  GLUCOSE 198* 195* 329*  BUN 12 15 13   CREATININE 1.00 0.80 1.05  CALCIUM 9.2  --  9.1   GFR: Estimated Creatinine Clearance: 124.1 mL/min (by C-G formula based on SCr of 1.05 mg/dL). Liver Function Tests: Recent Labs  Lab 03/15/18 1206  AST 26  ALT 51*  ALKPHOS 90  BILITOT 0.5  PROT 7.4  ALBUMIN 4.1   No results for input(s): LIPASE, AMYLASE in the last 168 hours. No results for input(s): AMMONIA in the last 168 hours. Coagulation Profile: Recent Labs  Lab  03/15/18 1206  INR 1.03   Cardiac Enzymes: No results for input(s): CKTOTAL, CKMB, CKMBINDEX, TROPONINI in the last 168 hours. BNP (last 3 results) No results for input(s): PROBNP in the last 8760 hours. HbA1C: No results for input(s): HGBA1C in the last 72 hours. CBG: Recent Labs  Lab 03/16/18 0628 03/16/18 1139 03/16/18 1650 03/16/18 2107 03/17/18 0636  GLUCAP 276* 244* 305* 295* 231*   Lipid Profile: No results for input(s): CHOL, HDL, LDLCALC, TRIG, CHOLHDL, LDLDIRECT in the last 72 hours. Thyroid Function Tests: No results for input(s): TSH, T4TOTAL, FREET4, T3FREE, THYROIDAB in the last 72 hours. Anemia Panel: No results for input(s): VITAMINB12, FOLATE, FERRITIN, TIBC, IRON, RETICCTPCT in the last 72 hours. Urine analysis:    Component Value Date/Time   COLORURINE YELLOW 10/04/2017 1530   APPEARANCEUR CLEAR 10/04/2017 1530   LABSPEC 1.024 10/04/2017 1530   PHURINE 5.0 10/04/2017 1530   GLUCOSEU >=500 (A) 10/04/2017 1530   HGBUR NEGATIVE 10/04/2017 1530   BILIRUBINUR NEGATIVE 10/04/2017 1530   KETONESUR 5 (A) 10/04/2017 1530   PROTEINUR 30 (A) 10/04/2017 1530   UROBILINOGEN 1.0 12/04/2010 1600   NITRITE NEGATIVE 10/04/2017 1530   LEUKOCYTESUR NEGATIVE 10/04/2017 1530   Sepsis Labs: @LABRCNTIP (procalcitonin:4,lacticidven:4)  )No results found for this or any previous visit (from the past 240 hour(s)).       Radiology Studies: Ct Head Wo Contrast  Result Date: 03/15/2018 CLINICAL DATA:  37 year old male with acute LEFT facial droop and slurred speech for 1 day. EXAM: CT HEAD WITHOUT CONTRAST TECHNIQUE: Contiguous axial images were obtained from the base of the skull through the vertex without intravenous contrast. COMPARISON:  None. FINDINGS: Brain: A small to moderate area of white matter edema in the subcortical RIGHT frontoparietal region is identified. The overlying cortex is not appear to be involved and likely represents vasogenic edema. No hemorrhage,  midline shift, hydrocephalus or extra-axial collection. No definite venous thrombosis identified. Vascular: No hyperdense vessel or unexpected calcification. Skull: Normal. Negative for fracture or focal lesion. Sinuses/Orbits: No acute finding. Other: None. IMPRESSION: 1. Small to moderate amount of RIGHT frontoparietal edema which appears to be vasogenic. MRI with and without contrast recommended for further evaluation. Electronically Signed   By: Margarette Canada M.D.   On: 03/15/2018 12:46   Mr Jeri Cos And Wo Contrast  Result Date: 03/15/2018 CLINICAL DATA:  37 year old male with abrupt onset left facial droop and slurred speech. Abnormal noncontrast head CT earlier suggesting vasogenic edema in the right hemisphere. EXAM: MRI HEAD WITHOUT AND WITH CONTRAST TECHNIQUE: Multiplanar, multiecho pulse sequences of the brain and surrounding structures were obtained without and with intravenous contrast. CONTRAST:  10 milliliters Gadavist COMPARISON:  Head CT without contrast 1233 hours today. FINDINGS: Brain: In the right middle frontal gyrus there is a 3 centimeter oval area of abnormal gray and white matter signal. There is moderate to intense increased trace diffusion signal throughout the lesion, but areas of both facilitated (centrally) and restricted (medial margin)  diffusion. These areas have corresponding high (central) and intermediate T2 and FLAIR hyperintensity. Cortex and regional white matter are affected as seen on series 11, image 14. Following contrast there is indistinct petechial enhancement which is best demonstrated on series 13, image 14. All told, the lesion encompasses 26 x 27 x 20 millimeters (AP by transverse by CC). There is a paucity of regional mass effect, and no larger area of surrounding edema. No 2nd lesion or other area of abnormal enhancement or signal abnormality is identified. No hemosiderin or abnormal mineralization. Normal gray and white matter signal elsewhere with no other  abnormal. No midline shift, ventriculomegaly, extra-axial collection or acute intracranial hemorrhage. Cervicomedullary junction and pituitary are within normal limits. No dural thickening. Vascular: Major intracranial vascular flow voids are preserved. The major dural venous sinuses are enhancing and appear patent. Skull and upper cervical spine: Negative visible cervical spine. Visualized bone marrow signal is within normal limits. Sinuses/Orbits: Orbit soft tissues appear symmetric and normal. Paranasal sinuses and mastoids are stable and well pneumatized. Other: Visible internal auditory structures appear normal. Scalp and face soft tissues appear negative. IMPRESSION: Solitary right middle frontal gyrus lesion measuring 2.7 cm involving gray and white matter and with indistinct petechial enhancement. 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 Other differential diagnoses such as ischemia and infectious encephalitis were considered but felt unlikely. Electronically Signed   By: Genevie Ann M.D.   On: 03/15/2018 16:28   Mr Cervical Spine Wo Contrast  Result Date: 03/16/2018 CLINICAL DATA:  Left facial droop and dysarthria for approximately 1 week. Abnormal brain MRI with findings worrisome for multiple sclerosis. EXAM: MRI CERVICAL AND THORACIC SPINE WITHOUT CONTRAST TECHNIQUE: Multiplanar and multiecho pulse sequences of the cervical spine, to include the craniocervical junction and cervicothoracic junction, and the thoracic spine, were obtained without intravenous contrast. COMPARISON:  MRI brain 03/15/2018. FINDINGS: MRI CERVICAL SPINE FINDINGS Alignment: Maintained with mild reversal of lordosis noted. Vertebrae: No fracture or worrisome lesion. Cord: Normal signal throughout. Posterior Fossa, vertebral arteries, paraspinal tissues: Negative. Disc levels: C2-3: Mild foraminal narrowing on the right  due to uncovertebral disease. The central canal and left foramen are widely patent. C3-4: Very shallow disc bulge to the right and mild uncovertebral disease on the right. The central canal and left foramen are widely patent. Mild right foraminal narrowing noted. C4-5: Minimal bulge without stenosis. C6-7: Shallow disc bulge and mild uncovertebral disease. The central canal is open. Mild foraminal narrowing is more notable on the right. C6-7: Negative. C7-T1: Negative. MRI THORACIC SPINE FINDINGS Alignment:  Normal. Vertebrae: No fracture or worrisome lesion. Tiny hemangioma in T7 incidentally noted. Cord:  Normal signal throughout. Paraspinal and other soft tissues: Normal. Disc levels: Disc height and hydration are maintained at all levels. No bulge or protrusion. The central canal and foramina are widely patent throughout. IMPRESSION: Normal-appearing cervical and thoracic cord. Negative for demyelinating disease. Cervical spondylosis results in mild right foraminal narrowing at C2-3 and C3-4 and mild bilateral foraminal narrowing at C6-7. The central canal is open at all levels. Normal thoracic spine MRI. Electronically Signed   By: Inge Rise M.D.   On: 03/16/2018 09:58   Mr Thoracic Spine Wo Contrast  Result Date: 03/16/2018 CLINICAL DATA:  Left facial droop and dysarthria for approximately 1 week. Abnormal brain MRI with findings worrisome for multiple sclerosis. EXAM: MRI CERVICAL AND THORACIC SPINE WITHOUT CONTRAST TECHNIQUE: Multiplanar  and multiecho pulse sequences of the cervical spine, to include the craniocervical junction and cervicothoracic junction, and the thoracic spine, were obtained without intravenous contrast. COMPARISON:  MRI brain 03/15/2018. FINDINGS: MRI CERVICAL SPINE FINDINGS Alignment: Maintained with mild reversal of lordosis noted. Vertebrae: No fracture or worrisome lesion. Cord: Normal signal throughout. Posterior Fossa, vertebral arteries, paraspinal tissues: Negative.  Disc levels: C2-3: Mild foraminal narrowing on the right due to uncovertebral disease. The central canal and left foramen are widely patent. C3-4: Very shallow disc bulge to the right and mild uncovertebral disease on the right. The central canal and left foramen are widely patent. Mild right foraminal narrowing noted. C4-5: Minimal bulge without stenosis. C6-7: Shallow disc bulge and mild uncovertebral disease. The central canal is open. Mild foraminal narrowing is more notable on the right. C6-7: Negative. C7-T1: Negative. MRI THORACIC SPINE FINDINGS Alignment:  Normal. Vertebrae: No fracture or worrisome lesion. Tiny hemangioma in T7 incidentally noted. Cord:  Normal signal throughout. Paraspinal and other soft tissues: Normal. Disc levels: Disc height and hydration are maintained at all levels. No bulge or protrusion. The central canal and foramina are widely patent throughout. IMPRESSION: Normal-appearing cervical and thoracic cord. Negative for demyelinating disease. Cervical spondylosis results in mild right foraminal narrowing at C2-3 and C3-4 and mild bilateral foraminal narrowing at C6-7. The central canal is open at all levels. Normal thoracic spine MRI. Electronically Signed   By: Inge Rise M.D.   On: 03/16/2018 09:58        Scheduled Meds: . lidocaine (cardiac) 100 mg/15mL      . lidocaine (PF)      . insulin aspart  0-15 Units Subcutaneous TID WC  . insulin aspart  0-5 Units Subcutaneous QHS  . insulin aspart  5 Units Subcutaneous TID WC  . insulin glargine  25 Units Subcutaneous Daily  . pantoprazole  40 mg Oral Q0600   Continuous Infusions: . methylPREDNISolone (SOLU-MEDROL) injection Stopped (03/15/18 2125)  . methylPREDNISolone (SOLU-MEDROL) injection 1,000 mg (03/16/18 1036)     LOS: 2 days    Time spent: 29min    Domenic Polite, MD [facility] Page via [email or web address], password [email or web address] After 7PM please contact night-coverage  03/17/2018, 9:55 AM

## 2018-03-17 NOTE — Progress Notes (Addendum)
Subjective: dysarthria has resolved, feels better and walking hall way  Exam: Vitals:   03/17/18 0427 03/17/18 0753  BP: 131/67 133/72  Pulse: 90 86  Resp: 18 18  Temp: 97.9 F (36.6 C) 98.2 F (36.8 C)  SpO2: 98% 98%   Physical Exam  HEENT-  Normocephalic, no lesions, without obvious abnormality.  Normal external eye and conjunctiva.   Cardiovascular- S1-S2 audible, pulses palpable throughout   Lungs-no rhonchi or wheezing noted, no excessive working breathing.  Saturations within normal limits Abdomen- All 4 quadrants palpated and nontender Extremities- Warm, dry and intact Musculoskeletal-no joint tenderness, deformity or swelling Skin-warm and dry, no hyperpigmentation, vitiligo, or suspicious lesions Neuro:  Mental Status: Alert, oriented, thought content appropriate.  Speech fluent without evidence of aphasia.  Able to follow 3 step commands without difficulty. Cranial Nerves: II:  Visual fields grossly normal,  III,IV, VI: ptosis not present, extra-ocular motions intact bilaterally pupils equal, round, reactive to light and accommodation V,VII: smile symmetric, facial light touch sensation normal bilaterally VIII: hearing normal bilaterally IX,X: uvula rises midline XI: bilateral shoulder shrug XII: midline tongue extension Motor: Right : Upper extremity   5/5    Left:     Upper extremity   5/5  Lower extremity   5/5     Lower extremity   5/5 Tone and bulk:normal tone throughout; no atrophy noted Sensory: Pinprick and light touch intact throughout, bilaterally Deep Tendon Reflexes: 2+ and symmetric throughout Plantars: Right: downgoing   Left: downgoing Cerebellar: normal finger-to-nose, normal rapid alternating movements and normal heel-to-shin test Gait: normal gait and station   Medications:  Scheduled: . lidocaine (cardiac) 100 mg/60mL      . insulin aspart  0-15 Units Subcutaneous TID WC  . insulin aspart  0-5 Units Subcutaneous QHS  . insulin aspart  5  Units Subcutaneous TID WC  . insulin glargine  25 Units Subcutaneous Daily  . pantoprazole  40 mg Oral Q0600   Continuous: . methylPREDNISolone (SOLU-MEDROL) injection Stopped (03/15/18 2125)  . methylPREDNISolone (SOLU-MEDROL) injection 1,000 mg (03/16/18 1036)   Pertinent Labs/Diagnostics: Ct Head Wo Contrast  Result Date: 03/15/2018 CLINICAL DATA:  37 year old male with acute LEFT facial droop and slurred speech for 1 day. EXAM: CT HEAD WITHOUT CONTRAST TECHNIQUE: Contiguous axial images were obtained from the base of the skull through the vertex without intravenous contrast. COMPARISON:  None. FINDINGS: Brain: A small to moderate area of white matter edema in the subcortical RIGHT frontoparietal region is identified. The overlying cortex is not appear to be involved and likely represents vasogenic edema. No hemorrhage, midline shift, hydrocephalus or extra-axial collection. No definite venous thrombosis identified. Vascular: No hyperdense vessel or unexpected calcification. Skull: Normal. Negative for fracture or focal lesion. Sinuses/Orbits: No acute finding. Other: None. IMPRESSION: 1. Small to moderate amount of RIGHT frontoparietal edema which appears to be vasogenic. MRI with and without contrast recommended for further evaluation. Electronically Signed   By: Margarette Canada M.D.   On: 03/15/2018 12:46   Mr Jeri Cos And Wo Contrast  Result Date: 03/15/2018 CLINICAL DATA:  37 year old male with abrupt onset left facial droop and slurred speech. Abnormal noncontrast head CT earlier suggesting vasogenic edema in the right hemisphere. EXAM: MRI HEAD WITHOUT AND WITH CONTRAST TECHNIQUE: Multiplanar, multiecho pulse sequences of the brain and surrounding structures were obtained without and with intravenous contrast. CONTRAST:  10 milliliters Gadavist COMPARISON:  Head CT without contrast 1233 hours today. FINDINGS: Brain: In the right middle frontal gyrus there is a  3 centimeter oval area of abnormal  gray and white matter signal. There is moderate to intense increased trace diffusion signal throughout the lesion, but areas of both facilitated (centrally) and restricted (medial margin) diffusion. These areas have corresponding high (central) and intermediate T2 and FLAIR hyperintensity. Cortex and regional white matter are affected as seen on series 11, image 14. Following contrast there is indistinct petechial enhancement which is best demonstrated on series 13, image 14. All told, the lesion encompasses 26 x 27 x 20 millimeters (AP by transverse by CC). There is a paucity of regional mass effect, and no larger area of surrounding edema. No 2nd lesion or other area of abnormal enhancement or signal abnormality is identified. No hemosiderin or abnormal mineralization. Normal gray and white matter signal elsewhere with no other abnormal. No midline shift, ventriculomegaly, extra-axial collection or acute intracranial hemorrhage. Cervicomedullary junction and pituitary are within normal limits. No dural thickening. Vascular: Major intracranial vascular flow voids are preserved. The major dural venous sinuses are enhancing and appear patent. Skull and upper cervical spine: Negative visible cervical spine. Visualized bone marrow signal is within normal limits. Sinuses/Orbits: Orbit soft tissues appear symmetric and normal. Paranasal sinuses and mastoids are stable and well pneumatized. Other: Visible internal auditory structures appear normal. Scalp and face soft tissues appear negative. IMPRESSION: Solitary right middle frontal gyrus lesion measuring 2.7 cm involving gray and white matter and with indistinct petechial enhancement. 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 Other differential diagnoses such as ischemia and infectious encephalitis were considered but felt unlikely. Electronically Signed    By: Genevie Ann M.D.   On: 03/15/2018 16:28   Mr Cervical Spine Wo Contrast  Result Date: 03/16/2018 CLINICAL DATA:  Left facial droop and dysarthria for approximately 1 week. Abnormal brain MRI with findings worrisome for multiple sclerosis. EXAM: MRI CERVICAL AND THORACIC SPINE WITHOUT CONTRAST TECHNIQUE: Multiplanar and multiecho pulse sequences of the cervical spine, to include the craniocervical junction and cervicothoracic junction, and the thoracic spine, were obtained without intravenous contrast. COMPARISON:  MRI brain 03/15/2018. FINDINGS: MRI CERVICAL SPINE FINDINGS Alignment: Maintained with mild reversal of lordosis noted. Vertebrae: No fracture or worrisome lesion. Cord: Normal signal throughout. Posterior Fossa, vertebral arteries, paraspinal tissues: Negative. Disc levels: C2-3: Mild foraminal narrowing on the right due to uncovertebral disease. The central canal and left foramen are widely patent. C3-4: Very shallow disc bulge to the right and mild uncovertebral disease on the right. The central canal and left foramen are widely patent. Mild right foraminal narrowing noted. C4-5: Minimal bulge without stenosis. C6-7: Shallow disc bulge and mild uncovertebral disease. The central canal is open. Mild foraminal narrowing is more notable on the right. C6-7: Negative. C7-T1: Negative. MRI THORACIC SPINE FINDINGS Alignment:  Normal. Vertebrae: No fracture or worrisome lesion. Tiny hemangioma in T7 incidentally noted. Cord:  Normal signal throughout. Paraspinal and other soft tissues: Normal. Disc levels: Disc height and hydration are maintained at all levels. No bulge or protrusion. The central canal and foramina are widely patent throughout. IMPRESSION: Normal-appearing cervical and thoracic cord. Negative for demyelinating disease. Cervical spondylosis results in mild right foraminal narrowing at C2-3 and C3-4 and mild bilateral foraminal narrowing at C6-7. The central canal is open at all levels.  Normal thoracic spine MRI. Electronically Signed   By: Inge Rise M.D.   On: 03/16/2018 09:58   Mr Thoracic Spine Wo Contrast  Result Date: 03/16/2018 CLINICAL DATA:  Left facial droop and dysarthria for approximately 1 week. Abnormal brain MRI with findings worrisome for multiple sclerosis. EXAM: MRI CERVICAL AND THORACIC SPINE WITHOUT CONTRAST TECHNIQUE: Multiplanar and multiecho pulse sequences of the cervical spine, to include the craniocervical junction and cervicothoracic junction, and the thoracic spine, were obtained without intravenous contrast. COMPARISON:  MRI brain 03/15/2018. FINDINGS: MRI CERVICAL SPINE FINDINGS Alignment: Maintained with mild reversal of lordosis noted. Vertebrae: No fracture or worrisome lesion. Cord: Normal signal throughout. Posterior Fossa, vertebral arteries, paraspinal tissues: Negative. Disc levels: C2-3: Mild foraminal narrowing on the right due to uncovertebral disease. The central canal and left foramen are widely patent. C3-4: Very shallow disc bulge to the right and mild uncovertebral disease on the right. The central canal and left foramen are widely patent. Mild right foraminal narrowing noted. C4-5: Minimal bulge without stenosis. C6-7: Shallow disc bulge and mild uncovertebral disease. The central canal is open. Mild foraminal narrowing is more notable on the right. C6-7: Negative. C7-T1: Negative. MRI THORACIC SPINE FINDINGS Alignment:  Normal. Vertebrae: No fracture or worrisome lesion. Tiny hemangioma in T7 incidentally noted. Cord:  Normal signal throughout. Paraspinal and other soft tissues: Normal. Disc levels: Disc height and hydration are maintained at all levels. No bulge or protrusion. The central canal and foramina are widely patent throughout. IMPRESSION: Normal-appearing cervical and thoracic cord. Negative for demyelinating disease. Cervical spondylosis results in mild right foraminal narrowing at C2-3 and C3-4 and mild bilateral foraminal  narrowing at C6-7. The central canal is open at all levels. Normal thoracic spine MRI. Electronically Signed   By: Inge Rise M.D.   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 Work-up is underway. There is history of vaccination about 2 weeks prior to onset of symptoms and possibility raised by Dr. Cheral Marker about this being demyelination being vaccine related adverse event.  I am not sure if that is the only cause and further work-up is mandated. Attempted bedside spinal tap failed due to inability to palpate landmarks.  I requested fluoroscopy guided spinal tap.  Recommendations: --LP under fluoro (flow cytology, IgG index, cytology, oligoclonal bands, protein and glucose, cell count) --continue methyl prednisone this is day 3.  Continue for a total of 5 days. --continue Protonix for GI protection while on steroids.  --Check neuromyelitis optica antibodies --Check hepatitis panel  04-14-18, 10:00 AM   Attending Neurohospitalist Addendum Patient seen and examined with APP/Resident. Agree with the history and physical as documented above. Agree with the plan as documented, which I helped formulate. I have independently reviewed the chart, obtained history, review of systems and examined the patient.I have personally reviewed pertinent head/neck/spine imaging (CT/MRI). Please feel free to call with any questions. --- Amie Portland, MD [facility] Pager: [phone]  If 7pm to 7am, please call on call as listed on AMION.

## 2018-03-17 NOTE — Procedures (Signed)
Procedure: LP w Fluoro guidance. Specimen: CSF, to lab Bleeding: minimal. Complications: None immediate. Patient   -Condition: Stable.  -Disposition:  Return to inpt ward.  Full Radiology Report to follow under IMAGING

## 2018-03-18 ENCOUNTER — Telehealth: Payer: Self-pay | Admitting: *Deleted

## 2018-03-18 DIAGNOSIS — C8590 Non-Hodgkin lymphoma, unspecified, unspecified site: Secondary | ICD-10-CM

## 2018-03-18 DIAGNOSIS — G939 Disorder of brain, unspecified: Secondary | ICD-10-CM

## 2018-03-18 DIAGNOSIS — G35 Multiple sclerosis: Principal | ICD-10-CM

## 2018-03-18 LAB — GLUCOSE, CAPILLARY
Glucose-Capillary: 211 mg/dL — ABNORMAL HIGH (ref 70–99)
Glucose-Capillary: 185 mg/dL — ABNORMAL HIGH (ref 70–99)
Glucose-Capillary: 323 mg/dL — ABNORMAL HIGH (ref 70–99)
Glucose-Capillary: 296 mg/dL — ABNORMAL HIGH (ref 70–99)

## 2018-03-18 LAB — HEPATITIS PANEL, ACUTE
HCV Ab: 0.1 s/co ratio (ref 0.0–0.9)
HEP A IGM: NEGATIVE
HEP B C IGM: NEGATIVE
Hepatitis B Surface Ag: NEGATIVE

## 2018-03-18 LAB — BASIC METABOLIC PANEL
Anion gap: 5 (ref 5–15)
BUN: 33 mg/dL — ABNORMAL HIGH (ref 6–20)
CALCIUM: 8.8 mg/dL — AB (ref 8.9–10.3)
CO2: 28 mmol/L (ref 22–32)
Chloride: 104 mmol/L (ref 98–111)
Creatinine, Ser: 1.11 mg/dL (ref 0.61–1.24)
GLUCOSE: 222 mg/dL — AB (ref 70–99)
Potassium: 3.9 mmol/L (ref 3.5–5.1)
Sodium: 137 mmol/L (ref 135–145)

## 2018-03-18 MED ORDER — INSULIN ASPART 100 UNIT/ML ~~LOC~~ SOLN
8.0000 [IU] | Freq: Three times a day (TID) | SUBCUTANEOUS | Status: DC
  Administered 2018-03-18 – 2018-03-19 (×3): 8 [IU] via SUBCUTANEOUS

## 2018-03-18 MED ORDER — SODIUM CHLORIDE 0.9 % IV SOLN
1000.0000 mg | Freq: Every day | INTRAVENOUS | Status: AC
  Administered 2018-03-18 – 2018-03-19 (×2): 1000 mg via INTRAVENOUS
  Filled 2018-03-18 (×2): qty 8

## 2018-03-18 NOTE — Progress Notes (Addendum)
Progress note Preliminary CSF results with mildly elevated protein 54, glucose 151, 7 RBCs, 3 WBCs. IgG index and oligoclonal bands pending. Patient feeling actively better-day 4 of IV steroids.   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 Felecia Shelling at [facility] neurology on discharge  Met with the patient and family, answered all the questions in detail.  Please call neurology with questions  -- Amie Portland, MD [facility] Pager: [phone] If 7pm to 7am, please call on call as listed on AMION. s.

## 2018-03-18 NOTE — Progress Notes (Signed)
Insulin administration education completed with pt and father at bedside. Both voices understanding and denies any questions. Pt handed education handout and pt taught and self administer afternoon insulin coverage. Will continue to monitor and educate. Delia Heady RN

## 2018-03-18 NOTE — Telephone Encounter (Signed)
Please advise 

## 2018-03-18 NOTE — Progress Notes (Signed)
PROGRESS NOTE  James Bautista TWS:[id number] DOB: 10/21/1980 DOA: 03/15/2018 PCP: Vivi Barrack, MD  HPI/Recap of past 24 hours: James Bautista a 37 y.o.malewith medical history significant forobesity, HLD, T2DMon MTF, seasonal allergies, presented to ED with c/o L sided facial droop, slurred speech. He noticed about a week ago that when he ate would bite the inside of his L cheek accidentally. Other than that he has been in his usual state of health until 11/3 when he noticed he was slurring his speech, then 11/4 morning he noticed he had a L facial droop.  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  [facility] asked to admit.  MRI brain with and without contrast done on 03/15/2018 revealed a solitary right medial frontal lobe lesion measuring 2.7 cm consistent with acute demyelination.  LP done.  No sign of infective process.  Neurology following and suspecting a case of demyelinating lesion consistent with Balo's concentric sclerosis which is a clinically isolated syndrome sometimes considered precursor to MS.  Cytology still pending.  Low suspicion for malignancy/lymphoma.  03/18/2018: Patient seen and examined with his family at bedside.  States his symptoms are improving.  Neurology following and recommends to complete 5 days course of IV steroids.  Today is day number 4 out of 5.  Will complete day 5 tomorrow with possible discharge.  Will follow-up with Dr. Felecia Shelling at [facility] neurology on discharge.  Assessment/Plan: Principal Problem:   Brain lesion Active Problems:   Type 2 diabetes mellitus (HCC)   Obesity   Seasonal allergies   Multiple sclerosis (HCC)   Sinus tachycardia  Resolving dysarthria/left facial droop suspect secondary to Balo's concentric sclerosis versus others Balo's concentric sclerosis is a clinically isolated syndrome sometimes considered a precursor to MS Continue IV steroids day 4 out of 5 Will  complete day 5 out of 5 of IV steroids on 03/19/2018 Will follow up with Dr. Melene Plan neurology on discharge Independently reviewed MRI of brain with and without contrast with patient and family which revealed a solitary lesion in the right medial frontal lobe. His symptoms are improving Cytology, flow cytology, IgG index, and oligoclonal bands still pending  Type 2 diabetes with hyperglycemia in the setting of high-dose IV steroids Continue to hold metformin Hemoglobin A1c 8.6 on 02/09/2018 Continue Lantus 25 units daily Increased dose of NovoLog from 5 unit 3 times daily before meals to 8 units 3 times daily before meals Continue Protonix 40 mg p.o. daily for GI prophylaxis Continue CBGs  Dehydration/hemoconcentration Improving Continue to encourage increasing oral fluid intake    Code Status: Full code  Family Communication: Family at bedside.  Disposition Plan: Home possibly tomorrow 03/19/2018   Consultants:  Neurology  Procedures:  Lumbar puncture  Antimicrobials:  None  DVT prophylaxis: SCDs   Objective: Vitals:   03/17/18 2318 03/18/18 0418 03/18/18 0700 03/18/18 0804  BP: 140/86 136/79  (!) 155/81  Pulse: 69 86  79  Resp: 20 18 18 18   Temp: 97.8 F (36.6 C) 98 F (36.7 C)  98.4 F (36.9 C)  TempSrc: Oral Oral  Oral  SpO2: 94% 93%  97%    Intake/Output Summary (Last 24 hours) at 03/18/2018 1123 Last data filed at 03/18/2018 0900 Gross per 24 hour  Intake 240 ml  Output -  Net 240 ml   There were no vitals filed for this visit.  Exam:  . General: 36  y.o. year-old male well developed well nourished in no acute distress.  Alert and oriented x3.  Dysarthria and facial droop appeared to have resolved. . Cardiovascular: Regular rate and rhythm with no rubs or gallops.  No thyromegaly or JVD noted.   [name] Respiratory: Clear to auscultation with no wheezes or rales. Good inspiratory effort. . Abdomen: Soft nontender nondistended with normal bowel  sounds x4 quadrants. . Musculoskeletal: No lower extremity edema. 2/4 pulses in all 4 extremities.  Good strength all throughout. . Skin: No ulcerative lesions noted or rashes, . Psychiatry: Mood is appropriate for condition and setting   Data Reviewed: CBC: Recent Labs  Lab 03/15/18 1206 03/15/18 1231 03/16/18 0354  WBC 8.0  --  8.0  NEUTROABS 5.3  --   --   HGB 16.0 16.7 15.8  HCT 49.5 49.0 49.8  MCV 78.2*  --  76.4*  PLT 190  --  270   Basic Metabolic Panel: Recent Labs  Lab 03/15/18 1206 03/15/18 1231 03/16/18 0354 03/18/18 0557  NA 135 138 134* 137  K 4.1 4.1 4.4 3.9  CL 104 101 103 104  CO2 25  --  22 28  GLUCOSE 198* 195* 329* 222*  BUN 12 15 13  33*  CREATININE 1.00 0.80 1.05 1.11  CALCIUM 9.2  --  9.1 8.8*   GFR: Estimated Creatinine Clearance: 117.4 mL/min (by C-G formula based on SCr of 1.11 mg/dL). Liver Function Tests: Recent Labs  Lab 03/15/18 1206  AST 26  ALT 51*  ALKPHOS 90  BILITOT 0.5  PROT 7.4  ALBUMIN 4.1   No results for input(s): LIPASE, AMYLASE in the last 168 hours. No results for input(s): AMMONIA in the last 168 hours. Coagulation Profile: Recent Labs  Lab 03/15/18 1206  INR 1.03   Cardiac Enzymes: No results for input(s): CKTOTAL, CKMB, CKMBINDEX, TROPONINI in the last 168 hours. BNP (last 3 results) No results for input(s): PROBNP in the last 8760 hours. HbA1C: No results for input(s): HGBA1C in the last 72 hours. CBG: Recent Labs  Lab 03/17/18 0636 03/17/18 1200 03/17/18 1638 03/17/18 2252 03/18/18 0608  GLUCAP 231* 233* 245* 250* 211*   Lipid Profile: No results for input(s): CHOL, HDL, LDLCALC, TRIG, CHOLHDL, LDLDIRECT in the last 72 hours. Thyroid Function Tests: No results for input(s): TSH, T4TOTAL, FREET4, T3FREE, THYROIDAB in the last 72 hours. Anemia Panel: No results for input(s): VITAMINB12, FOLATE, FERRITIN, TIBC, IRON, RETICCTPCT in the last 72 hours. Urine analysis:    Component Value Date/Time     COLORURINE YELLOW 10/04/2017 Coy 10/04/2017 1530   LABSPEC 1.024 10/04/2017 1530   PHURINE 5.0 10/04/2017 1530   GLUCOSEU >=500 (A) 10/04/2017 1530   HGBUR NEGATIVE 10/04/2017 1530   BILIRUBINUR NEGATIVE 10/04/2017 1530   KETONESUR 5 (A) 10/04/2017 1530   PROTEINUR 30 (A) 10/04/2017 1530   UROBILINOGEN 1.0 12/04/2010 1600   NITRITE NEGATIVE 10/04/2017 1530   LEUKOCYTESUR NEGATIVE 10/04/2017 1530   Sepsis Labs: @LABRCNTIP (procalcitonin:4,lacticidven:4)  ) Recent Results (from the past 240 hour(s))  CSF culture     Status: None (Preliminary result)   Collection Time: 03/17/18  3:24 PM  Result Value Ref Range Status   Specimen Description CSF  Final   Special Requests NONE  Final   Gram Stain   Final    WBC PRESENT, PREDOMINANTLY MONONUCLEAR NO ORGANISMS SEEN CYTOSPIN SMEAR    Culture   Final    NO GROWTH < 24 HOURS Performed at [facility],  [address], [place], [place] [address]    Report Status PENDING  Incomplete      Studies: Dg Fluoro Guide Lumbar Puncture  Result Date: 03/17/2018 CLINICAL DATA:  37 year old male with right frontal brain lesion suspected due to demyelination. Unsuccessful bedside lumbar puncture for CSF analysis. EXAM: DIAGNOSTIC LUMBAR PUNCTURE UNDER FLUOROSCOPIC GUIDANCE FLUOROSCOPY TIME:  Fluoroscopy Time:  0 minutes 12 seconds Radiation Exposure Index (if provided by the fluoroscopic device): Number of Acquired Spot Images: 0 PROCEDURE: Informed consent was obtained from the patient prior to the procedure, including potential complications of headache, allergy, and pain. A "time-out" was performed. With the patient prone, the lower back was prepped with Betadine. 1% Lidocaine was used for local anesthesia. Lumbar puncture was performed at the L2-L3 level using a 3.5 in x 20 gauge needle with return of clear, colorless CSF. An opening pressure with the patient prone of 21.5 cm water was obtained. 19 ml of CSF were  obtained for laboratory studies. The needle was withdrawn, direct pressure held and hemostasis noted. The patient tolerated the procedure well and there were no apparent complications. Appropriate post procedural orders were placed on the chart. The patient was returned to the inpatient floor in stable condition for continued treatment. IMPRESSION: Fluoroscopic guided lumbar puncture. 19 mL of clear CSF were obtained and sent to the lab for analysis. Electronically Signed   By: Genevie Ann M.D.   On: 03/17/2018 16:00    Scheduled Meds: . insulin aspart  0-15 Units Subcutaneous TID WC  . insulin aspart  0-5 Units Subcutaneous QHS  . insulin aspart  8 Units Subcutaneous TID WC  . insulin glargine  25 Units Subcutaneous Daily  . pantoprazole  40 mg Oral Q0600    Continuous Infusions: . methylPREDNISolone (SOLU-MEDROL) injection 1,000 mg (03/18/18 1107)     LOS: 3 days     Kayleen Memos, MD [facility] Pager [phone]  If 7PM-7AM, please contact night-coverage [email or web address] Password [email or web address] 03/18/2018, 11:23 AM

## 2018-03-18 NOTE — Telephone Encounter (Signed)
Copied from [place] [id number]. Topic: General - Inquiry >> Mar 18, 2018  8:47 AM Judyann Munson wrote: Reason for CRM: Patient is calling to request Dr.Parker or his nurse to call back. The patient is admitted in the ER and he is being treated with steroid shots. He stated the doctor advise before his is discharge they want his primary doctor to be available to give the patients Insulin shots if his Blood sugar evaluate. He is requesting a call back. Please advise

## 2018-03-18 NOTE — Telephone Encounter (Signed)
Pt is admitted and being treated for MS via IV steroids, looks like he may be discharged tomorrow and they are not planning on sending him home on insulin.  We do not carry insulin on hand in our office and would not be able to give him insulin injections as needed.  However, if he needs insulin as an outpatient, we would be able to help manage his dosing parameters.  Would recommend that he become familiar and comfortable with self administered insulin while he is hospitalized. It would be much easier to do while he is an inpatient instead of trying to coordinate as an outpatient.   [name]. Jerline Pain, MD 03/18/2018 10:52 AM

## 2018-03-19 DIAGNOSIS — R000 Tachycardia, unspecified: Secondary | ICD-10-CM

## 2018-03-19 DIAGNOSIS — J302 Other seasonal allergic rhinitis: Secondary | ICD-10-CM

## 2018-03-19 LAB — IGG CSF INDEX
ALBUMIN CSF-MCNC: 36 mg/dL (ref 11–48)
Albumin: 4.3 g/dL (ref 3.5–5.5)
IGG CSF: 5.6 mg/dL (ref 0.0–8.6)
IGG INDEX CSF: 0.5 (ref 0.0–0.7)
IgG (Immunoglobin G), Serum: 1290 mg/dL (ref 700–1600)
IgG/Alb Ratio, CSF: 0.16 (ref 0.00–0.25)

## 2018-03-19 LAB — GLUCOSE, CAPILLARY
Glucose-Capillary: 209 mg/dL — ABNORMAL HIGH (ref 70–99)
Glucose-Capillary: 239 mg/dL — ABNORMAL HIGH (ref 70–99)

## 2018-03-19 LAB — NEUROMYELITIS OPTICA AUTOAB, IGG: NMO-IgG: <1.5 U/mL (ref 0.0–3.0)

## 2018-03-19 MED ORDER — INSULIN STARTER KIT- PEN NEEDLES (ENGLISH): 1.0000 | Freq: Once | 0 refills | Status: AC

## 2018-03-19 MED ORDER — INSULIN ASPART 100 UNIT/ML ~~LOC~~ SOLN: 0.0000 [IU] | Freq: Three times a day (TID) | SUBCUTANEOUS | 0 refills | Status: DC

## 2018-03-19 MED ORDER — INSULIN ASPART 100 UNIT/ML FLEXPEN: 0.0000 [IU] | PEN_INJECTOR | Freq: Three times a day (TID) | SUBCUTANEOUS | 0 refills | Status: DC

## 2018-03-19 MED ORDER — LIVING WELL WITH DIABETES BOOK: 1.0000 | Freq: Once | 0 refills | Status: AC

## 2018-03-19 MED ORDER — INSULIN GLARGINE 100 UNIT/ML SOLOSTAR PEN: 15.0000 [IU] | PEN_INJECTOR | Freq: Every day | SUBCUTANEOUS | 0 refills | Status: DC

## 2018-03-19 MED ORDER — INSULIN STARTER KIT- PEN NEEDLES (ENGLISH)
1.0000 | Freq: Once | Status: DC
  Filled 2018-03-19: qty 1

## 2018-03-19 MED ORDER — PANTOPRAZOLE SODIUM 40 MG PO TBEC: 40.0000 mg | DELAYED_RELEASE_TABLET | Freq: Every day | ORAL | 0 refills | Status: DC

## 2018-03-19 MED ORDER — BLOOD GLUCOSE MONITORING SUPPL DEVI: 1.0000 | 0 refills | Status: DC

## 2018-03-19 MED ORDER — LIVING WELL WITH DIABETES BOOK
Freq: Once | Status: DC
  Filled 2018-03-19: qty 1

## 2018-03-19 MED ORDER — BLOOD GLUCOSE MONITOR KIT: PACK | 0 refills | Status: DC

## 2018-03-19 MED ORDER — INSULIN GLARGINE 100 UNIT/ML ~~LOC~~ SOLN: 15.0000 [IU] | Freq: Every day | SUBCUTANEOUS | 0 refills | Status: DC

## 2018-03-19 NOTE — Care Management Note (Signed)
Case Management Note  Patient Details  Name: James Bautista MRN: [id number] Date of Birth: 1980/12/06  Subjective/Objective:    Pt admitted with brain lesion. He his from home and [facility]. Pt denies any DME.  Pt denise issues with obtaining his medications or with transportation.                 Action/Plan: Pt discharging home with self care. CM provided him with discount card that work with his insurance to assist with the cost of new insulin.  Wife able to provide transport home and supervision at home.   Expected Discharge Date:  03/19/18               Expected Discharge Plan:  Home/Self Care  In-House Referral:     Discharge planning Services  CM Consult  Post Acute Care Choice:    Choice offered to:     DME Arranged:    DME Agency:     HH Arranged:    HH Agency:     Status of Service:  Completed, signed off  If discussed at [employer] of Stay Meetings, dates discussed:    Additional Comments:  Pollie Friar, RN 03/19/2018, 12:25 PM

## 2018-03-19 NOTE — Discharge Summary (Addendum)
Discharge Summary  James Bautista:[id number] DOB: Feb 11, 1981  PCP: James Barrack, MD  Admit date: 03/15/2018 Discharge date: 03/19/2018  Time spent: 35 minutes  Recommendations for Outpatient Follow-up:  1. Follow-up with neurology 2. Follow-up with your PCP 3. Take your medications as prescribed  Discharge Diagnoses:  Active Hospital Problems   Diagnosis Date Noted  . Brain lesion 03/15/2018  . Multiple sclerosis ([place]) 03/15/2018  . Sinus tachycardia 03/15/2018  . Seasonal allergies 10/08/2017  . Obesity 02/09/2013  . Type 2 diabetes mellitus ([place]) 12/06/2010    Resolved Hospital Problems  No resolved problems to display.    Discharge Condition: Stable  Diet recommendation: Resume previous diet  Vitals:   03/19/18 0502 03/19/18 0908  BP: 133/74 (!) 153/87  Pulse: 77 67  Resp: 18 20  Temp: 97.9 F (36.6 C) 97.6 F (36.4 C)  SpO2: 96% 97%    History of present illness:  James Bautista a 37 y.o.malewith medical history significant forobesity, HLD, T2DMon metformin, seasonal allergies, presented to ED with c/o L sided facial droop, slurred speech. He noticed about a week ago that when he ate would bite the inside of his L cheek accidentally. Other than that he has been in his usual state of health until 11/3 when he noticed he was slurring his speech, then 11/4 morning he noticed he had a L facial droop.  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  [facility] asked to admit.  MRI brain done with and without contrast on 03/15/2018 revealed a solitary right medial frontal lobe lesion measuring 2.7 cm consistent with acute demyelination.  LP was done.    Results of LP revealed no sign of infective process.  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   Cytology still pending.    There was low suspicion for malignancy/lymphoma.  During the course of his hospital stay he had no complications and completed 5 days of IV steroids with near resolution of his symptoms.  Neurology recommended follow-up with Dr. Felecia Bautista at [facility] neurology on discharge.   03/19/2018: Patient seen and examined with his wife at bedside.  No acute events overnight.  Reports his symptoms are much improved and nearly all resolved.  He has no new complaints.  All questions answered to his satisfaction.  On the day of discharge, the patient was hemodynamically stable.  He will need to follow-up with his PCP and neurology posthospitalization.   Hospital Course:  Principal Problem:   Brain lesion Active Problems:   Type 2 diabetes mellitus ([facility])   Obesity   Seasonal allergies   Multiple sclerosis ([facility])   Sinus tachycardia  Resolving dysarthria/left facial droop suspect secondary to Balo's concentric sclerosis versus others Balo's concentric sclerosis is a clinically isolated syndrome sometimes considered a precursor to MS Completed 5 out of 5 days of IV steroids Will follow up with Dr. Melene Bautista neurology on discharge Independently reviewed MRI of brain with and without contrast with patient and family which revealed a solitary lesion in the right medial frontal lobe. His symptoms are improving Cytology, flow cytology, IgG index, and oligoclonal bands pending  Type 2 diabetes with hyperglycemia in the setting of high-dose IV steroids Resume metformin at discharge Continue insulin at discharge and follow-up with your PCP within a week Hemoglobin A1c  8.6 on 02/09/2018 Continue CBGs prior to insulin administration Continue Protonix 4 mg p.o. daily for GI prophylaxis  Dehydration/hemoconcentration Improving Continue to encourage increase in oral fluid intake    Code Status: Full code   Consultants:  Neurology  Procedures:  Lumbar  puncture  Antimicrobials:  None    Discharge Exam: BP (!) 153/87 (BP Location: Left Arm)   Pulse 67   Temp 97.6 F (36.4 C) (Oral)   Resp 20   SpO2 97%  . General: 37 y.o. year-old male well developed well nourished in no acute distress.  Alert and oriented x3. . Cardiovascular: Regular rate and rhythm with no rubs or gallops.  No thyromegaly or JVD noted.   [name] Respiratory: Clear to auscultation with no wheezes or rales. Good inspiratory effort. . Abdomen: Soft nontender nondistended with normal bowel sounds x4 quadrants. . Musculoskeletal: No lower extremity edema. 2/4 pulses in all 4 extremities. . Skin: No ulcerative lesions noted or rashes, . Psychiatry: Mood is appropriate for condition and setting  Discharge Instructions You were cared for by a hospitalist during your hospital stay. If you have any questions about your discharge medications or the care you received while you were in the hospital after you are discharged, you can call the unit and asked to speak with the hospitalist on call if the hospitalist that took care of you is not available. Once you are discharged, your primary care physician will handle any further medical issues. Please note that NO REFILLS for any discharge medications will be authorized once you are discharged, as it is imperative that you return to your primary care physician (or establish a relationship with a primary care physician if you do not have one) for your aftercare needs so that they can reassess your need for medications and monitor your lab values.  Discharge Instructions    Ambulatory referral to Neurology   Complete by:  As directed    An appointment is requested in approximately: 2-4Weeks     Allergies as of 03/19/2018      Reactions   Neomycin Swelling, Other (See Comments)   Neomycin ear drops - ears swelled shut   Banana Rash, Other (See Comments)   "Tastes like eating a porcupine"   Nickel Rash   Peach [prunus Persica] Rash,  Other (See Comments)   "Tastes like eating a porcupine"      Medication List    TAKE these medications   aspirin EC 325 MG tablet Take 325 mg by mouth once as needed (for headahces or pain).   blood glucose meter kit and supplies Kit Dispense based on patient and insurance preference. Use up to four times daily as directed. (FOR ICD-9 250.00, 250.01).   Blood Glucose Monitoring Suppl Devi 1 kit by Does not apply route as directed for 1 day.   fexofenadine 180 MG tablet Commonly known as:  ALLEGRA Take 180 mg by mouth daily as needed for allergies or rhinitis.   fluticasone 50 MCG/ACT nasal spray Commonly known as:  FLONASE Place 2 sprays into both nostrils daily as needed for allergies or rhinitis.   insulin aspart 100 UNIT/ML FlexPen Commonly known as:  NOVOLOG Inject 0-15 Units into the skin 3 (three) times daily with meals.   Insulin Glargine 100 UNIT/ML Solostar Pen Commonly known as:  LANTUS Inject 15 Units into the skin daily.   insulin starter kit- pen needles Misc 1 kit by Other route once for 1 dose.   living well with diabetes book  Misc 1 each by Does not apply route once for 1 dose.   metFORMIN 750 MG 24 hr tablet Commonly known as:  GLUCOPHAGE-XR Take 1 tablet (750 mg total) by mouth daily with breakfast.   MUCUS RELIEF ER 1200 MG Tb12 Generic drug:  Guaifenesin Take 1,200 mg by mouth daily as needed (for nasal drainage).   pantoprazole 40 MG tablet Commonly known as:  PROTONIX Take 1 tablet (40 mg total) by mouth daily at 6 (six) AM. Start taking on:  03/20/2018      Allergies  Allergen Reactions  . Neomycin Swelling and Other (See Comments)    Neomycin ear drops - ears swelled shut  . Banana Rash and Other (See Comments)    "Tastes like eating a porcupine"  . Nickel Rash  . Peach [Prunus Persica] Rash and Other (See Comments)    "Tastes like eating a porcupine"   Follow-up Information    James Barrack, MD. Call in 1 day(s).   Specialty:   Family Medicine Why:  Please call for a post hospital follow-up appointment within a week. Contact information: [place] [address] [phone]        Neena Rhymes, MD .   Specialty:  Internal Medicine Contact information: [place] [address] [phone]        Britt Bottom, MD. Call in 1 day(s).   Specialty:  Neurology Why:  Please call for a post hospital follow-up appointment within a week. Contact information: [place] [address] [phone]            The results of significant diagnostics from this hospitalization (including imaging, microbiology, ancillary and laboratory) are listed below for reference.    Significant Diagnostic Studies: Ct Head Wo Contrast  Result Date: 03/15/2018 CLINICAL DATA:  37 year old male with acute LEFT facial droop and slurred speech for 1 day. EXAM: CT HEAD WITHOUT CONTRAST TECHNIQUE: Contiguous axial images were obtained from the base of the skull through the vertex without intravenous contrast. COMPARISON:  None. FINDINGS: Brain: A small to moderate area of white matter edema in the subcortical RIGHT frontoparietal region is identified. The overlying cortex is not appear to be involved and likely represents vasogenic edema. No hemorrhage, midline shift, hydrocephalus or extra-axial collection. No definite venous thrombosis identified. Vascular: No hyperdense vessel or unexpected calcification. Skull: Normal. Negative for fracture or focal lesion. Sinuses/Orbits: No acute finding. Other: None. IMPRESSION: 1. Small to moderate amount of RIGHT frontoparietal edema which appears to be vasogenic. MRI with and without contrast recommended for further evaluation. Electronically Signed   By: Margarette Canada M.D.   On: 03/15/2018 12:46   Mr Jeri Cos And Wo Contrast  Result Date: 03/15/2018 CLINICAL DATA:  37 year old male with abrupt onset left facial droop and slurred speech. Abnormal  noncontrast head CT earlier suggesting vasogenic edema in the right hemisphere. EXAM: MRI HEAD WITHOUT AND WITH CONTRAST TECHNIQUE: Multiplanar, multiecho pulse sequences of the brain and surrounding structures were obtained without and with intravenous contrast. CONTRAST:  10 milliliters Gadavist COMPARISON:  Head CT without contrast 1233 hours today. FINDINGS: Brain: In the right middle frontal gyrus there is a 3 centimeter oval area of abnormal gray and white matter signal. There is moderate to intense increased trace diffusion signal throughout the lesion, but areas of both facilitated (centrally) and restricted (medial margin) diffusion. These areas have corresponding high (central) and intermediate T2 and FLAIR hyperintensity. Cortex and regional white matter are affected  as seen on series 11, image 14. Following contrast there is indistinct petechial enhancement which is best demonstrated on series 13, image 14. All told, the lesion encompasses 26 x 27 x 20 millimeters (AP by transverse by CC). There is a paucity of regional mass effect, and no larger area of surrounding edema. No 2nd lesion or other area of abnormal enhancement or signal abnormality is identified. No hemosiderin or abnormal mineralization. Normal gray and white matter signal elsewhere with no other abnormal. No midline shift, ventriculomegaly, extra-axial collection or acute intracranial hemorrhage. Cervicomedullary junction and pituitary are within normal limits. No dural thickening. Vascular: Major intracranial vascular flow voids are preserved. The major dural venous sinuses are enhancing and appear patent. Skull and upper cervical spine: Negative visible cervical spine. Visualized bone marrow signal is within normal limits. Sinuses/Orbits: Orbit soft tissues appear symmetric and normal. Paranasal sinuses and mastoids are stable and well pneumatized. Other: Visible internal auditory structures appear normal. Scalp and face soft tissues  appear negative. IMPRESSION: Solitary right middle frontal gyrus lesion measuring 2.7 cm involving gray and white matter and with indistinct petechial enhancement. 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 Other differential diagnoses such as ischemia and infectious encephalitis were considered but felt unlikely. Electronically Signed   By: Genevie Ann M.D.   On: 03/15/2018 16:28   Mr Cervical Spine Wo Contrast  Result Date: 03/16/2018 CLINICAL DATA:  Left facial droop and dysarthria for approximately 1 week. Abnormal brain MRI with findings worrisome for multiple sclerosis. EXAM: MRI CERVICAL AND THORACIC SPINE WITHOUT CONTRAST TECHNIQUE: Multiplanar and multiecho pulse sequences of the cervical spine, to include the craniocervical junction and cervicothoracic junction, and the thoracic spine, were obtained without intravenous contrast. COMPARISON:  MRI brain 03/15/2018. FINDINGS: MRI CERVICAL SPINE FINDINGS Alignment: Maintained with mild reversal of lordosis noted. Vertebrae: No fracture or worrisome lesion. Cord: Normal signal throughout. Posterior Fossa, vertebral arteries, paraspinal tissues: Negative. Disc levels: C2-3: Mild foraminal narrowing on the right due to uncovertebral disease. The central canal and left foramen are widely patent. C3-4: Very shallow disc bulge to the right and mild uncovertebral disease on the right. The central canal and left foramen are widely patent. Mild right foraminal narrowing noted. C4-5: Minimal bulge without stenosis. C6-7: Shallow disc bulge and mild uncovertebral disease. The central canal is open. Mild foraminal narrowing is more notable on the right. C6-7: Negative. C7-T1: Negative. MRI THORACIC SPINE FINDINGS Alignment:  Normal. Vertebrae: No fracture or worrisome lesion. Tiny hemangioma in T7 incidentally noted. Cord:  Normal signal throughout. Paraspinal  and other soft tissues: Normal. Disc levels: Disc height and hydration are maintained at all levels. No bulge or protrusion. The central canal and foramina are widely patent throughout. IMPRESSION: Normal-appearing cervical and thoracic cord. Negative for demyelinating disease. Cervical spondylosis results in mild right foraminal narrowing at C2-3 and C3-4 and mild bilateral foraminal narrowing at C6-7. The central canal is open at all levels. Normal thoracic spine MRI. Electronically Signed   By: Inge Rise M.D.   On: 03/16/2018 09:58   Mr Thoracic Spine Wo Contrast  Result Date: 03/16/2018 CLINICAL DATA:  Left facial droop and dysarthria for approximately 1 week. Abnormal brain MRI with findings worrisome for multiple sclerosis. EXAM: MRI CERVICAL AND THORACIC SPINE WITHOUT CONTRAST TECHNIQUE: Multiplanar and multiecho pulse sequences of the cervical spine, to include the craniocervical junction and cervicothoracic junction, and the thoracic spine,  were obtained without intravenous contrast. COMPARISON:  MRI brain 03/15/2018. FINDINGS: MRI CERVICAL SPINE FINDINGS Alignment: Maintained with mild reversal of lordosis noted. Vertebrae: No fracture or worrisome lesion. Cord: Normal signal throughout. Posterior Fossa, vertebral arteries, paraspinal tissues: Negative. Disc levels: C2-3: Mild foraminal narrowing on the right due to uncovertebral disease. The central canal and left foramen are widely patent. C3-4: Very shallow disc bulge to the right and mild uncovertebral disease on the right. The central canal and left foramen are widely patent. Mild right foraminal narrowing noted. C4-5: Minimal bulge without stenosis. C6-7: Shallow disc bulge and mild uncovertebral disease. The central canal is open. Mild foraminal narrowing is more notable on the right. C6-7: Negative. C7-T1: Negative. MRI THORACIC SPINE FINDINGS Alignment:  Normal. Vertebrae: No fracture or worrisome lesion. Tiny hemangioma in T7  incidentally noted. Cord:  Normal signal throughout. Paraspinal and other soft tissues: Normal. Disc levels: Disc height and hydration are maintained at all levels. No bulge or protrusion. The central canal and foramina are widely patent throughout. IMPRESSION: Normal-appearing cervical and thoracic cord. Negative for demyelinating disease. Cervical spondylosis results in mild right foraminal narrowing at C2-3 and C3-4 and mild bilateral foraminal narrowing at C6-7. The central canal is open at all levels. Normal thoracic spine MRI. Electronically Signed   By: Inge Rise M.D.   On: 03/16/2018 09:58   Dg Fluoro Guide Lumbar Puncture  Result Date: 03/17/2018 CLINICAL DATA:  37 year old male with right frontal brain lesion suspected due to demyelination. Unsuccessful bedside lumbar puncture for CSF analysis. EXAM: DIAGNOSTIC LUMBAR PUNCTURE UNDER FLUOROSCOPIC GUIDANCE FLUOROSCOPY TIME:  Fluoroscopy Time:  0 minutes 12 seconds Radiation Exposure Index (if provided by the fluoroscopic device): Number of Acquired Spot Images: 0 PROCEDURE: Informed consent was obtained from the patient prior to the procedure, including potential complications of headache, allergy, and pain. A "time-out" was performed. With the patient prone, the lower back was prepped with Betadine. 1% Lidocaine was used for local anesthesia. Lumbar puncture was performed at the L2-L3 level using a 3.5 in x 20 gauge needle with return of clear, colorless CSF. An opening pressure with the patient prone of 21.5 cm water was obtained. 19 ml of CSF were obtained for laboratory studies. The needle was withdrawn, direct pressure held and hemostasis noted. The patient tolerated the procedure well and there were no apparent complications. Appropriate post procedural orders were placed on the chart. The patient was returned to the inpatient floor in stable condition for continued treatment. IMPRESSION: Fluoroscopic guided lumbar puncture. 19 mL of clear  CSF were obtained and sent to the lab for analysis. Electronically Signed   By: Genevie Ann M.D.   On: 03/17/2018 16:00    Microbiology: Recent Results (from the past 240 hour(s))  CSF culture     Status: None (Preliminary result)   Collection Time: 03/17/18  3:24 PM  Result Value Ref Range Status   Specimen Description CSF  Final   Special Requests NONE  Final   Gram Stain   Final    WBC PRESENT, PREDOMINANTLY MONONUCLEAR NO ORGANISMS SEEN CYTOSPIN SMEAR    Culture   Final    NO GROWTH 2 DAYS Performed at [facility], [address], [place], [place] [address]    Report Status PENDING  Incomplete     Labs: Basic Metabolic Panel: Recent Labs  Lab 03/15/18 1206 03/15/18 1231 03/16/18 0354 03/18/18 0557  NA 135 138 134* 137  K 4.1 4.1 4.4 3.9  CL 104  101 103 104  CO2 25  --  22 28  GLUCOSE 198* 195* 329* 222*  BUN _0 33*  CREATININE 1.00 0.80 1.05 1.11  CALCIUM 9.2  --  9.1 8.8*   Liver Function Tests: Recent Labs  Lab 03/15/18 1206  AST 26  ALT 51*  ALKPHOS 90  BILITOT 0.5  PROT 7.4  ALBUMIN 4.1   No results for input(s): LIPASE, AMYLASE in the last 168 hours. No results for input(s): AMMONIA in the last 168 hours. CBC: Recent Labs  Lab 03/15/18 1206 03/15/18 1231 03/16/18 0354  WBC 8.0  --  8.0  NEUTROABS 5.3  --   --   HGB 16.0 16.7 15.8  HCT 49.5 49.0 49.8  MCV 78.2*  --  76.4*  PLT 190  --  213   Cardiac Enzymes: No results for input(s): CKTOTAL, CKMB, CKMBINDEX, TROPONINI in the last 168 hours. BNP: BNP (last 3 results) No results for input(s): BNP in the last 8760 hours.  ProBNP (last 3 results) No results for input(s): PROBNP in the last 8760 hours.  CBG: Recent Labs  Lab 03/18/18 0608 03/18/18 1125 03/18/18 1646 03/18/18 2124 03/19/18 0643  GLUCAP 211* 185* 323* 296* 209*       Signed:  Kayleen Memos, MD [facility] 03/19/2018, 11:43 AM

## 2018-03-19 NOTE — Progress Notes (Signed)
NURSING PROGRESS NOTE  James Bautista [id number] Discharge Data: 03/19/2018 1:51 PM Attending Provider: Kayleen Memos, DO QJJ:HERDEY, [name], MD     Hart Carwin to be D/C'd Home per MD order.  Discussed with the patient the After Visit Summary and all questions fully answered. All IV's discontinued with no bleeding noted. All belongings returned to patient for patient to take home.   Last Vital Signs:  Blood pressure (!) 153/87, pulse 67, temperature 97.6 F (36.4 C), temperature source Oral, resp. rate 20, SpO2 97 %.  Discharge Medication List Allergies as of 03/19/2018      Reactions   Neomycin Swelling, Other (See Comments)   Neomycin ear drops - ears swelled shut   Banana Rash, Other (See Comments)   "Tastes like eating a porcupine"   Nickel Rash   Peach [prunus Persica] Rash, Other (See Comments)   "Tastes like eating a porcupine"      Medication List    TAKE these medications   aspirin EC 325 MG tablet Take 325 mg by mouth once as needed (for headahces or pain).   blood glucose meter kit and supplies Kit Dispense based on patient and insurance preference. Use up to four times daily as directed. (FOR ICD-9 250.00, 250.01).   Blood Glucose Monitoring Suppl Devi 1 kit by Does not apply route as directed for 1 day.   fexofenadine 180 MG tablet Commonly known as:  ALLEGRA Take 180 mg by mouth daily as needed for allergies or rhinitis.   fluticasone 50 MCG/ACT nasal spray Commonly known as:  FLONASE Place 2 sprays into both nostrils daily as needed for allergies or rhinitis.   insulin aspart 100 UNIT/ML FlexPen Commonly known as:  NOVOLOG Inject 0-15 Units into the skin 3 (three) times daily with meals.   Insulin Glargine 100 UNIT/ML Solostar Pen Commonly known as:  LANTUS Inject 15 Units into the skin daily.   insulin starter kit- pen needles Misc 1 kit by Other route once for 1 dose.   living well with diabetes book Misc 1 each by Does not apply route once  for 1 dose.   metFORMIN 750 MG 24 hr tablet Commonly known as:  GLUCOPHAGE-XR Take 1 tablet (750 mg total) by mouth daily with breakfast.   MUCUS RELIEF ER 1200 MG Tb12 Generic drug:  Guaifenesin Take 1,200 mg by mouth daily as needed (for nasal drainage).   pantoprazole 40 MG tablet Commonly known as:  PROTONIX Take 1 tablet (40 mg total) by mouth daily at 6 (six) AM. Start taking on:  03/20/2018

## 2018-03-19 NOTE — Progress Notes (Signed)
Inpatient Diabetes Program Recommendations  AACE/ADA: New Consensus Statement on Inpatient Glycemic Control (2015)  Target Ranges:  Prepandial:   less than 140 mg/dL      Peak postprandial:   less than 180 mg/dL (1-2 hours)      Critically ill patients:  140 - 180 mg/dL   Lab Results  Component Value Date   GLUCAP 209 (H) 03/19/2018   HGBA1C 8.6 (A) 02/09/2018    Review of Glycemic Control  Long discussion with pt and wife regarding diagnosis of DM2. HgbA1C of 8.6% - is followed by PCP. Pt had been on metformin at one time, and control had improved and he was taken off.  Has been on Solumedrol 1000 mg QD.  Instructed on insulin pen administration and pt and wife acknowledged understanding. Discussed glucose monitoring and taking logbook to MD for review. Discussed hypoglycemia s/s and treatment. Instructed to call MD if blood sugars < 70 mg/dL or > 250 mg/dL.  Will be discharged no insulin pens -  Lantus 15 units QD Novolog 0-15 units tidwc and hs.  Discussed above with MD and RN.  Thank you. Lorenda Peck, RD, LDN, CDE Inpatient Diabetes Coordinator [phone]

## 2018-03-21 LAB — CSF CULTURE W GRAM STAIN

## 2018-03-21 LAB — CSF CULTURE: CULTURE: NO GROWTH

## 2018-03-22 ENCOUNTER — Telehealth: Payer: Self-pay | Admitting: *Deleted

## 2018-03-22 NOTE — Telephone Encounter (Signed)
Per chart review: Admit date: 03/15/2018 Discharge date: 03/19/2018  Time spent: 35 minutes  Recommendations for Outpatient Follow-up:  1. Follow-up with neurology 2. Follow-up with your PCP 3. Take your medications as prescribed  Discharge Diagnoses:      Active Hospital Problems   Diagnosis Date Noted  . Brain lesion 03/15/2018  . Multiple sclerosis ([place]) 03/15/2018  . Sinus tachycardia 03/15/2018  . Seasonal allergies 10/08/2017  . Obesity 02/09/2013  . Type 2 diabetes mellitus ([place]) 12/06/2010    Resolved Hospital Problems  No resolved problems to display.    Discharge Condition: Stable  Diet recommendation: Resume previous diet __________________________________________________________________________ Per telephone encounter: Transition Care Management Follow-up Telephone Call   Date discharged? 03/19/18   How have you been since you were released from the hospital? "good"   Do you understand why you were in the hospital? yes   Do you understand the discharge instructions? yes   Where were you discharged to? Home   Items Reviewed:  Medications reviewed: yes  Allergies reviewed: yes  Dietary changes reviewed: yes  Referrals reviewed: yes   Functional Questionnaire:  Activities of Daily Living (ADLs):   He states they are independent in the following: ambulation, bathing and hygiene, feeding, continence, grooming, toileting and dressing States they require assistance with the following: none   Any transportation issues/concerns?: no   Any patient concerns? No. Patient state his glucose this morning was 179. States he is using the sliding scale insulin instructions and using the longer term insulin. Explained that he can discuss transition further at his hospital follow up appt.    Confirmed importance and date/time of follow-up visits scheduled yes  Provider Appointment booked with Dr Jerline Pain 03/25/18 1:20  Confirmed with patient if  condition begins to worsen call PCP or go to the ER.  Patient was given the office number and encouraged to call back with question or concerns.  : yes

## 2018-03-25 ENCOUNTER — Telehealth: Payer: Self-pay | Admitting: *Deleted

## 2018-03-25 ENCOUNTER — Ambulatory Visit: Payer: 59 | Admitting: Family Medicine

## 2018-03-25 ENCOUNTER — Encounter: Payer: Self-pay | Admitting: Family Medicine

## 2018-03-25 VITALS — BP 124/84 | HR 95 | Temp 98.5°F | Ht 72.0 in | Wt 225.4 lb

## 2018-03-25 DIAGNOSIS — E119 Type 2 diabetes mellitus without complications: Secondary | ICD-10-CM | POA: Diagnosis not present

## 2018-03-25 DIAGNOSIS — G939 Disorder of brain, unspecified: Secondary | ICD-10-CM

## 2018-03-25 LAB — OLIGOCLONAL BANDS, CSF + SERM

## 2018-03-25 MED ORDER — METFORMIN HCL ER 500 MG PO TB24: 1000.0000 mg | ORAL_TABLET | Freq: Two times a day (BID) | ORAL | 3 refills | Status: DC

## 2018-03-25 NOTE — Assessment & Plan Note (Signed)
His neurological exam is stable.  Do not need to do further testing at this point.  Has neurology appointment upcoming in 1 month.  Discussed reasons to return to care and seek emergent care.

## 2018-03-25 NOTE — Patient Instructions (Signed)
It was very nice to see you today!  We will increase metformin to 1000 mg twice daily.  Please let me me know if this causes significant side effects.  Please stop the NovoLog.  I would like for you to check your sugar only once during the day.  Please check a fasting sugar every morning.  If your numbers less than 150, please decrease your dose of Basaglar by 1 unit.  If your number is greater than 150, stay at your previous dose of Basaglar.  You can stop your Basaglar completely once you are down to 7 or 8 units/day.  Please let me know if your symptoms worsen or do not gradually improve over the next several weeks.  I will see you back in early January, or sooner as needed.  Take care, Dr Jerline Pain

## 2018-03-25 NOTE — Telephone Encounter (Signed)
Copied from [place] [id number]. Topic: General - Other >> Mar 24, 2018  4:31 PM Keene Breath wrote: Reason for CRM: Patient called to request the details of his flu shot he received on 02/09/18.  Patient stated he needs to know what kind of shot he received.  CB# [phone]

## 2018-03-25 NOTE — Telephone Encounter (Signed)
The patient is being seen in the office now.

## 2018-03-25 NOTE — Progress Notes (Signed)
Subjective:  James Bautista is a 37 y.o. male who presents today for a TCM visit.  HPI:  Summary of Hospital admission: Reason for admission: Left facial droop and slurred speech Date of admission: 03/15/2018 Date of discharge: 03/19/2018 Date of Interactive contact: 03/22/2018 Summary of Hospital course: Patient presented to the ED on 03/15/2018 with 1 day of facial droop and slurred speech.  MRI was performed in the emergency department which revealed a solitary right medial frontal lesion approximately 2.7 cm in diameter consistent with acute demyelination.  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  Patient was admitted to the hospital for 5 days of IV steroids.  He had an LP performed during his hospitalization in addition to serologies.  Symptoms gradually improved during his hospitalization he was discharged home on hospital day 5 with near resolution of his symptoms.  He was started on insulin while hospitalized due to high blood sugars.  He was also started on Protonix 40 mg daily for GI prophylaxis while on high-dose steroids.  Interim History:   Neurological Deficits Patient has noticed gradual improvement of his neurological symptoms since being discharged.  He still has a small amount of residual facial droop and slurred speech.  Additionally has a small residual amount of decreased sensation and weakness in his left upper extremity.  He is doing well otherwise.  He has an occasional dull headache.  No new deficits since being hospitalized.  T2DM Patient was started on Basaglar 15 units daily along with sliding scale NovoLog at hospital discharge.  He was continued on metformin 750 mg once daily.  Since being home, his sugars have gradually improved.  They were initially in the 200s, but now are mostly in the mid 100s.  ROS: Per HPI, otherwise a complete review of systems was negative.   PMH:  The following were reviewed and  entered/updated in epic: Past Medical History:  Diagnosis Date  . Allergy    seasonal  . Diabetes mellitus ([place])   . Hyperlipemia   . Kidney stone    never passed a stone or had radiographic evidence of stone.   . Prostatitis 2010   had to wear catheter for urinary retention. [facility] urologist.    Patient Active Problem List   Diagnosis Date Noted  . Brain lesion 03/15/2018  . Multiple sclerosis ([place]) 03/15/2018  . Seasonal allergies 10/08/2017  . Obesity 02/09/2013  . Type 2 diabetes mellitus ([place]) 12/06/2010   Past Surgical History:  Procedure Laterality Date  . FEMUR FRACTURE SURGERY  '92   right leg.  [name] LASIK    . WISDOM TOOTH EXTRACTION Bilateral 10/2017    Family History  Problem Relation Age of Onset  . Diabetes Father   . Hyperlipidemia Father   . Goiter Sister   . Cancer Paternal Aunt        ovarian cancer  . Cancer Maternal Grandmother        breast  . COPD Maternal Grandmother   . Heart disease Paternal Grandmother   . Dementia Paternal Grandmother   . Thyroid disease Paternal Grandmother   . Diabetes Paternal Grandfather   . Heart disease Paternal Grandfather   . Multiple sclerosis Neg Hx     Medications- Reconciled discharge and current medications in Epic.  Current Outpatient Medications  Medication Sig Dispense Refill  . aspirin EC 325 MG tablet Take 325 mg by mouth once as needed (for headahces or pain).    [name]  blood glucose meter kit and supplies KIT Dispense based on patient and insurance preference. Use up to four times daily as directed. (FOR ICD-9 250.00, 250.01). 1 each 0  . fexofenadine (ALLEGRA) 180 MG tablet Take 180 mg by mouth daily as needed for allergies or rhinitis.    . fluticasone (FLONASE) 50 MCG/ACT nasal spray Place 2 sprays into both nostrils daily as needed for allergies or rhinitis.     . Guaifenesin (MUCUS RELIEF ER) 1200 MG TB12 Take 1,200 mg by mouth daily as needed (for nasal drainage).     . Insulin Glargine (LANTUS SOLOSTAR)  100 UNIT/ML Solostar Pen Inject 15 Units into the skin daily. 15 mL 0  . pantoprazole (PROTONIX) 40 MG tablet Take 1 tablet (40 mg total) by mouth daily at 6 (six) AM. 14 tablet 0  . metFORMIN (GLUCOPHAGE-XR) 500 MG 24 hr tablet Take 2 tablets (1,000 mg total) by mouth 2 (two) times daily. 360 tablet 3   No current facility-administered medications for this visit.     Allergies-reviewed and updated Allergies  Allergen Reactions  . Neomycin Swelling and Other (See Comments)    Neomycin ear drops - ears swelled shut  . Banana Rash and Other (See Comments)    "Tastes like eating a porcupine"  . Nickel Rash  . Peach [Prunus Persica] Rash and Other (See Comments)    "Tastes like eating a porcupine"    Social History   Socioeconomic History  . Marital status: Married    Spouse name: Gresham Caetano  . Number of children: 0  . Years of education: 30  . Highest education level: Not on file  Occupational History  . Occupation: Trial Chief Executive Officer  Social Needs  . Financial resource strain: Not on file  . Food insecurity:    Worry: Not on file    Inability: Not on file  . Transportation needs:    Medical: Not on file    Non-medical: Not on file  Tobacco Use  . Smoking status: Never Smoker  . Smokeless tobacco: Never Used  Substance and Sexual Activity  . Alcohol use: No    Alcohol/week: 0.0 standard drinks    Comment: 1 drink per month  . Drug use: No  . Sexual activity: Yes    Partners: Female  Lifestyle  . Physical activity:    Days per week: 0 days    Minutes per session: 0 min  . Stress: Very much  Relationships  . Social connections:    Talks on phone: Not on file    Gets together: Not on file    Attends religious service: Not on file    Active member of club or organization: Not on file    Attends meetings of clubs or organizations: Not on file    Relationship status: Not on file  Other Topics Concern  . Not on file  Social History Narrative   [place], [employer] 2010. Married. Uses condoms. 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    Objective:  Physical Exam: BP 124/84 (BP Location: Left Arm, Patient Position: Sitting, Cuff Size: Large)   Pulse 95   Temp 98.5 F (36.9 C) (Oral)   Ht 6' (1.829 m)   Wt 225 lb 6.4 oz (102.2 kg)   SpO2 99%   BMI 30.57 kg/m   Gen: NAD, resting comfortably CV: RRR with no murmurs appreciated Pulm: NWOB, CTAB with no crackles, wheezes, or rhonchi GI: Normal bowel sounds  present. Soft, Nontender, Nondistended. MSK: No edema, cyanosis, or clubbing noted Skin: Warm, dry Neuro: Very slight left facial droop noted, otherwise cranial nerves III through XII intact.  4+ out of 5 strength with thumb-finger opposition in left hand and finger abduction/adduction.  Strength otherwise 5 out of 5 throughout.  Sensation light touch slightly decreased on left upper extremity compared to right upper extremity.  Two-point discrimination intact in left and right hands. Psych: Normal affect and thought content  Assessment/Plan:  Type 2 diabetes mellitus ([place]) Patient with elevated sugars in the setting of acute illness and IV steroids.  We will max out his dose of metformin to 1000 mg twice daily.  Instructed patient to stop taking NovoLog. We will titrate down his dose of Basaglar-please see AVS for instructions.  He will follow-up with me in 6 to 7 weeks.  Brain lesion His neurological exam is stable.  Do not need to do further testing at this point.  Has neurology appointment upcoming in 1 month.  Discussed reasons to return to care and seek emergent care.  Time Spent: I spent >40 minutes face-to-face with the patient, with more than half spent on counseling for management plan for his brain lesion and type 2 diabetes.   [name]. Jerline Pain, MD 03/25/2018 2:24 PM

## 2018-03-25 NOTE — Assessment & Plan Note (Signed)
Patient with elevated sugars in the setting of acute illness and IV steroids.  We will max out his dose of metformin to 1000 mg twice daily.  Instructed patient to stop taking NovoLog. We will titrate down his dose of Basaglar-please see AVS for instructions.  He will follow-up with me in 6 to 7 weeks.

## 2018-04-21 ENCOUNTER — Encounter: Payer: Self-pay | Admitting: Neurology

## 2018-04-21 ENCOUNTER — Other Ambulatory Visit: Payer: Self-pay

## 2018-04-21 ENCOUNTER — Ambulatory Visit: Payer: 59 | Admitting: Neurology

## 2018-04-21 ENCOUNTER — Telehealth: Payer: Self-pay | Admitting: Neurology

## 2018-04-21 VITALS — BP 154/97 | HR 98 | Resp 16 | Ht 72.0 in | Wt 225.0 lb

## 2018-04-21 DIAGNOSIS — G379 Demyelinating disease of central nervous system, unspecified: Secondary | ICD-10-CM

## 2018-04-21 DIAGNOSIS — R200 Anesthesia of skin: Secondary | ICD-10-CM | POA: Diagnosis not present

## 2018-04-21 DIAGNOSIS — G939 Disorder of brain, unspecified: Secondary | ICD-10-CM

## 2018-04-21 DIAGNOSIS — R278 Other lack of coordination: Secondary | ICD-10-CM

## 2018-04-21 NOTE — Progress Notes (Signed)
[facility]  PATIENT: James Bautista DOB: Feb 20, 1981  REFERRING DOCTOR OR PCP:  Dimas Chyle, MD SOURCE: Patient, notes from hospital admission, imaging and laboratory reports, MRI images personally reviewed.  _________________________________   HISTORICAL  CHIEF COMPLAINT:  Chief Complaint  Patient presents with  . Abnormal MRI Brain    Rm. 57.  [name] is here with his wife [name] to discuss possible MS dx.  Sts. in Oct. he began biting the left side of his cheek while eating. Then, on 03/15/18, he had onset of left sided facial droop and slurred speech. Also some numbness left fingers, difficulty with fine motor skills. Seen in the ER where  MRI shows one lesion ? MS. LP had 7 oligoclonal bands not present in the blood. He was given 5 days of IV SM, sts. he believes he is close to his baseline./fim    HISTORY OF PRESENT ILLNESS:  I had the pleasure seeing a patient, James Bautista, at the [place] center at [facility] for neurologic consultation regarding his left facial numbness, slurred speech and abnormal brain MRI.     He is a 37 year old man who was at his baseline until mid October 2019.  At that time, he noted that he started biting the inside of his mouth.   He had a dental appointment which was non-contributary.   He continued to have some inner cheek biting the next couple weeks.   On 03/14/2018, he had the onset of left facial dysesthesias.   He noticed a sinus stuffy sensation.    While speaking, he noticed his voice was slurred.   He works as a Chief Executive Officer.  The next day, while looking in a mirror, he noted his smile was asymmetric, weaker on the left.   Additionally, speech was more slurred.  He presented to the [facility] ED 03/15/2018.   CT scan showed a region of edema in the posterior right frontal lobe.[name]   His brain MRI showed a large right subcortical focus with patchy enhancement and restriction on DWI.     He was admitted and had 5 days of IV  Solu-Medrol.  Every day, he noted some improvement.   By discharge, slurred speech was much better but there was still some facial > hand numbness.    He did have some problems with left hand coordination for a few weeks, now nearly completely resolved.   His smile is now nearly symmetric    He has never had any other neurologic symptom lasting more than a few minutes.  He has never noted any significant visual symptom.     He has essential hypertension and diabetes mellitus.  He had the flu-shot 02/09/2018.   The vaccination was Fluarix quadrivalent Lot [id number]  Exp 11/09/2018   [place] [id number].    No antecedent infections.  I personally reviewed the MRI of the brain 03/15/2018 and MRI of the cervical thoracic spine 03/16/2018.  The MRI of the brain shows a juxtacortical/subcortical large T2/FLAIR hyperintense focus that also showed on diffusion-weighted images.  There was some peripheral enhancement incompletely.  There were no other lesions in the brain . MRI of the spine did not show any additional lesions.  There is no spinal cord compression though he has mild spinal stenosis at C4-C5.  REVIEW OF SYSTEMS: Constitutional: No fevers, chills, sweats, or change in appetite Eyes: No visual changes, double vision, eye pain Ear, nose and throat: No hearing loss, ear pain, nasal congestion, sore throat Cardiovascular: No chest  pain, palpitations Respiratory: No shortness of breath at rest or with exertion.   No wheezes GastrointestinaI: No nausea, vomiting, diarrhea, abdominal pain, fecal incontinence Genitourinary: No dysuria, urinary retention or frequency.  No nocturia. Musculoskeletal: No neck pain, back pain Integumentary: No rash, pruritus, skin lesions Neurological: as above Psychiatric: No depression at this time.  No anxiety Endocrine: He has diabetes and is on metformin and insulin.  Hematologic/Lymphatic: No anemia, purpura, petechiae. Allergic/Immunologic: No itchy/runny eyes, nasal  congestion, recent allergic reactions, rashes  ALLERGIES: Allergies  Allergen Reactions  . Neomycin Swelling and Other (See Comments)    Neomycin ear drops - ears swelled shut  . Banana Rash and Other (See Comments)    "Tastes like eating a porcupine"  . Nickel Rash  . Peach [Prunus Persica] Rash and Other (See Comments)    "Tastes like eating a porcupine"    HOME MEDICATIONS:  Current Outpatient Medications:  .  blood glucose meter kit and supplies KIT, Dispense based on patient and insurance preference. Use up to four times daily as directed. (FOR ICD-9 250.00, 250.01)., Disp: 1 each, Rfl: 0 .  fexofenadine (ALLEGRA) 180 MG tablet, Take 180 mg by mouth daily as needed for allergies or rhinitis., Disp: , Rfl:  .  fluticasone (FLONASE) 50 MCG/ACT nasal spray, Place 2 sprays into both nostrils daily as needed for allergies or rhinitis. , Disp: , Rfl:  .  Guaifenesin (MUCUS RELIEF ER) 1200 MG TB12, Take 1,200 mg by mouth daily as needed (for nasal drainage). , Disp: , Rfl:  .  metFORMIN (GLUCOPHAGE-XR) 500 MG 24 hr tablet, Take 2 tablets (1,000 mg total) by mouth 2 (two) times daily., Disp: 360 tablet, Rfl: 3 .  Insulin Glargine (LANTUS SOLOSTAR) 100 UNIT/ML Solostar Pen, Inject 15 Units into the skin daily. (Patient not taking: Reported on 04/21/2018), Disp: 15 mL, Rfl: 0 .  pantoprazole (PROTONIX) 40 MG tablet, Take 1 tablet (40 mg total) by mouth daily at 6 (six) AM. (Patient not taking: Reported on 04/21/2018), Disp: 14 tablet, Rfl: 0  PAST MEDICAL HISTORY: Past Medical History:  Diagnosis Date  . Allergy    seasonal  . Diabetes mellitus ([place])   . Hyperlipemia   . Kidney stone    never passed a stone or had radiographic evidence of stone.   . Prostatitis 2010   had to wear catheter for urinary retention. [facility] urologist.     PAST SURGICAL HISTORY: Past Surgical History:  Procedure Laterality Date  . FEMUR FRACTURE SURGERY  '92   right leg.  [name] LASIK    . WISDOM TOOTH  EXTRACTION Bilateral 10/2017    FAMILY HISTORY: Family History  Problem Relation Age of Onset  . Diabetes Father   . Hyperlipidemia Father   . Goiter Sister   . Cancer Paternal Aunt        ovarian cancer  . Cancer Maternal Grandmother        breast  . COPD Maternal Grandmother   . Heart disease Paternal Grandmother   . Dementia Paternal Grandmother   . Thyroid disease Paternal Grandmother   . Diabetes Paternal Grandfather   . Heart disease Paternal Grandfather   . Multiple sclerosis Neg Hx     SOCIAL HISTORY:  Social History   Socioeconomic History  . Marital status: Married    Spouse name: Rylynn Schoneman  . Number of children: 0  . Years of education: 59  . Highest education level: Not on file  Occupational History  . Occupation: Trial  lawyer  Social Needs  . Financial resource strain: Not on file  . Food insecurity:    Worry: Not on file    Inability: Not on file  . Transportation needs:    Medical: Not on file    Non-medical: Not on file  Tobacco Use  . Smoking status: Never Smoker  . Smokeless tobacco: Never Used  Substance and Sexual Activity  . Alcohol use: No    Alcohol/week: 0.0 standard drinks    Comment: 1 drink per month  . Drug use: No  . Sexual activity: Yes    Partners: Female  Lifestyle  . Physical activity:    Days per week: 0 days    Minutes per session: 0 min  . Stress: Very much  Relationships  . Social connections:    Talks on phone: Not on file    Gets together: Not on file    Attends religious service: Not on file    Active member of club or organization: Not on file    Attends meetings of clubs or organizations: Not on file    Relationship status: Not on file  . Intimate partner violence:    Fear of current or ex partner: Not on file    Emotionally abused: Not on file    Physically abused: Not on file    Forced sexual activity: Not on file  Other Topics Concern  . Not on file  Social History Narrative   [place] - [place], [employer] 2010. Married. Uses condoms. 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     PHYSICAL EXAM  Vitals:   04/21/18 1343  BP: (!) 154/97  Pulse: 98  Resp: 16  Weight: 225 lb (102.1 kg)  Height: 6' (1.829 m)    Body mass index is 30.52 kg/m.   General: The patient is well-developed and well-nourished and in no acute distress  Eyes:  Funduscopic exam shows normal optic discs and retinal vessels.  Neck: The neck is supple, no carotid bruits are noted.  The neck is nontender.  Cardiovascular: The heart has a regular rate and rhythm with a normal S1 and S2. There were no murmurs, gallops or rubs. Lungs are clear to auscultation.  Skin: Extremities are without significant edema.  Musculoskeletal:  Back is nontender  Neurologic Exam  Mental status: The patient is alert and oriented x 3 at the time of the examination. The patient has apparent normal recent and remote memory, with an apparently normal attention span and concentration ability.   Speech is normal.  Cranial nerves: Extraocular movements are full. Pupils are equal, round, and reactive to light and accomodation.  Visual fields are full.  Facial symmetry is present.  He has very slight reduced sensation to touch and temperature around the lips on the left.  The inside of the right cheek was numb.  The tongue was not numb.  Facial strength is normal.  Trapezius and sternocleidomastoid strength is normal. No dysarthria is noted.  The tongue is midline, and the patient has symmetric elevation of the soft palate. No obvious hearing deficits are noted.  Motor:  Muscle bulk is normal.   Tone is normal. Strength is  5 / 5 in all 4 extremities.   Sensory: Sensory testing is intact to pinprick, soft touch and vibration sensation in all 4 extremities.  Coordination: Cerebellar testing reveals good finger-nose-finger and heel-to-shin bilaterally.  Gait and station: Station is normal.   Gait is normal. Tandem  gait is normal. Romberg is negative.   Reflexes: Deep tendon reflexes are symmetric and normal bilaterally.   Plantar responses are flexor.    DIAGNOSTIC DATA (LABS, IMAGING, TESTING) - I reviewed patient records, labs, notes, testing and imaging myself where available.  Lab Results  Component Value Date   WBC 8.0 03/16/2018   HGB 15.8 03/16/2018   HCT 49.8 03/16/2018   MCV 76.4 (L) 03/16/2018   PLT 213 03/16/2018      Component Value Date/Time   NA 137 03/18/2018 0557   K 3.9 03/18/2018 0557   CL 104 03/18/2018 0557   CO2 28 03/18/2018 0557   GLUCOSE 222 (H) 03/18/2018 0557   BUN 33 (H) 03/18/2018 0557   CREATININE 1.11 03/18/2018 0557   CALCIUM 8.8 (L) 03/18/2018 0557   PROT 7.4 03/15/2018 1206   ALBUMIN 4.3 03/17/2018 1632   AST 26 03/15/2018 1206   ALT 51 (H) 03/15/2018 1206   ALKPHOS 90 03/15/2018 1206   BILITOT 0.5 03/15/2018 1206   GFRNONAA >60 03/18/2018 0557   GFRAA >60 03/18/2018 0557   Lab Results  Component Value Date   CHOL 136 12/29/2013   HDL 29.90 (L) 12/29/2013   LDLCALC 69 12/29/2013   LDLDIRECT 67.2 10/13/2012   TRIG 186.0 (H) 12/29/2013   CHOLHDL 5 12/29/2013   Lab Results  Component Value Date   HGBA1C 8.6 (A) 02/09/2018   No results found for: VITAMINB12 Lab Results  Component Value Date   TSH 3.32 12/29/2013       ASSESSMENT AND PLAN  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 Reamy is a 37 year old man with left facial numbness followed by slurred speech who was found to have a large focus on MRI in the juxtacortical and subcortical white matter of the posterior right frontal lobe.  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  Stroke is less likely.  A neoplasm or B-cell lymphoma cannot be ruled out but  his clinical course makes this less likely.  A demyelinating lesion could be caused by MS.  However, 2 weeks before he had any symptoms he did have a flu shot which might have triggered a monophasic demyelination event the lumbar puncture is more consistent with MS.  I discussed with Mr. Gilman and his wife that I believe the likelihood that he has MS is about two thirds and that a monophasic demyelinating event would be the second most likely diagnosis.  We do need to check blood work to make sure that there is not evidence of vasculitis.  I would like to recheck a brain MRI in about 6 weeks and compared to his previous MRI.  This will allow [place] to determine if there are any new lesions consistent with MS and to follow-up on the existing lesion.  If he has new lesions consistent with MS in the interim, then I will get him started on a disease modifying therapy.  Otherwise, I would recommend that we do another MRI in another 6 months.  He will return to see me in about 2 months or sooner if there are new or worsening neurologic symptoms.  Thank you for asking me to see Mr. Arnell Sieving.  Please let me know if I can be of further assistance with him or other patients in the future.  Efrem Pitstick A. Felecia Shelling, MD, [facility] [id number], 6:94 PM Certified in Neurology, Clinical Neurophysiology, Sleep Medicine, Pain Medicine and Neuroimaging  [facility] [address], [place], [place] [address] [phone]

## 2018-04-21 NOTE — Telephone Encounter (Signed)
[employer] schedule in late or early Feb lvm for pt to call back to schedule the MRI

## 2018-04-23 LAB — ENA+DNA/DS+SJORGEN'S
ENA RNP Ab: 0.2 AI (ref 0.0–0.9)
ENA SM Ab Ser-aCnc: <0.2 AI (ref 0.0–0.9)
ENA SSA (RO) AB: 3.2 AI — AB (ref 0.0–0.9)
ENA SSB (LA) Ab: <0.2 AI (ref 0.0–0.9)
dsDNA Ab: <1 IU/mL (ref 0–9)

## 2018-04-23 LAB — PAN-ANCA
ANCA Proteinase 3: <3.5 U/mL (ref 0.0–3.5)
Atypical pANCA: <1:20 {titer}
C-ANCA: <1:20 {titer}
P-ANCA: <1:20 {titer}

## 2018-04-23 LAB — HIV ANTIBODY (ROUTINE TESTING W REFLEX): HIV SCREEN 4TH GENERATION: NONREACTIVE

## 2018-04-23 LAB — ANA W/REFLEX: ANA: POSITIVE — AB

## 2018-04-28 ENCOUNTER — Telehealth: Payer: Self-pay | Admitting: *Deleted

## 2018-04-28 NOTE — Telephone Encounter (Signed)
JCV lab drawn on 04/21/18 negative, titer: 0.14. Given to Dr. Felecia Shelling to review.

## 2018-04-29 ENCOUNTER — Other Ambulatory Visit (INDEPENDENT_AMBULATORY_CARE_PROVIDER_SITE_OTHER): Payer: 59

## 2018-04-29 DIAGNOSIS — E119 Type 2 diabetes mellitus without complications: Secondary | ICD-10-CM | POA: Diagnosis not present

## 2018-04-29 DIAGNOSIS — Z13220 Encounter for screening for lipoid disorders: Secondary | ICD-10-CM

## 2018-04-29 DIAGNOSIS — Z114 Encounter for screening for human immunodeficiency virus [HIV]: Secondary | ICD-10-CM

## 2018-04-29 LAB — LIPID PANEL
CHOLESTEROL: 151 mg/dL (ref 0–200)
HDL: 31.3 mg/dL — AB (ref >39.00)
NonHDL: 120.18
Total CHOL/HDL Ratio: 5
Triglycerides: 232 mg/dL — ABNORMAL HIGH (ref 0.0–149.0)
VLDL: 46.4 mg/dL — AB (ref 0.0–40.0)

## 2018-04-29 LAB — COMPREHENSIVE METABOLIC PANEL
ALBUMIN: 4.6 g/dL (ref 3.5–5.2)
ALT: 39 U/L (ref 0–53)
AST: 19 U/L (ref 0–37)
Alkaline Phosphatase: 82 U/L (ref 39–117)
BUN: 13 mg/dL (ref 6–23)
CHLORIDE: 101 meq/L (ref 96–112)
CO2: 30 mEq/L (ref 19–32)
CREATININE: 0.88 mg/dL (ref 0.40–1.50)
Calcium: 9.4 mg/dL (ref 8.4–10.5)
GFR: 104.69 mL/min (ref >60.00)
Glucose, Bld: 82 mg/dL (ref 70–99)
POTASSIUM: 4 meq/L (ref 3.5–5.1)
Sodium: 140 mEq/L (ref 135–145)
Total Bilirubin: 0.5 mg/dL (ref 0.2–1.2)
Total Protein: 7 g/dL (ref 6.0–8.3)

## 2018-04-29 LAB — HEMOGLOBIN A1C: HEMOGLOBIN A1C: 7 % — AB (ref 4.6–6.5)

## 2018-04-29 LAB — CBC
HEMATOCRIT: 47.1 % (ref 39.0–52.0)
Hemoglobin: 15.9 g/dL (ref 13.0–17.0)
MCHC: 33.7 g/dL (ref 30.0–36.0)
MCV: 77.9 fl — ABNORMAL LOW (ref 78.0–100.0)
Platelets: 186 10*3/uL (ref 150.0–400.0)
RBC: 6.04 Mil/uL — ABNORMAL HIGH (ref 4.22–5.81)
RDW: 16.2 % — ABNORMAL HIGH (ref 11.5–15.5)
WBC: 9.1 10*3/uL (ref 4.0–10.5)

## 2018-04-29 LAB — LDL CHOLESTEROL, DIRECT: Direct LDL: 99 mg/dL

## 2018-04-29 NOTE — Addendum Note (Signed)
Addended by: Kayren Eaves T on: 04/29/2018 01:49 PM   Modules accepted: Orders

## 2018-04-29 NOTE — Addendum Note (Signed)
Addended by: Kayren Eaves T on: 04/29/2018 01:50 PM   Modules accepted: Orders

## 2018-05-03 NOTE — Progress Notes (Signed)
Please inform patient of the following:  A1c is 7. Much better than previously. Would like for him to continue weaning insulin like we discussed at his last OV.  His "good" cholesterol is a bit low but otherwise everything else looks good. Would not make any other medication changes at this point.  We can discuss more at his visit in a couple weeks.  [name]. Jerline Pain, MD 05/03/2018 1:09 PM

## 2018-05-17 ENCOUNTER — Encounter: Payer: Self-pay | Admitting: Family Medicine

## 2018-05-17 ENCOUNTER — Ambulatory Visit (INDEPENDENT_AMBULATORY_CARE_PROVIDER_SITE_OTHER): Payer: 59 | Admitting: Family Medicine

## 2018-05-17 VITALS — BP 112/82 | HR 74 | Temp 98.0°F | Ht 72.0 in | Wt 223.8 lb

## 2018-05-17 DIAGNOSIS — E785 Hyperlipidemia, unspecified: Secondary | ICD-10-CM

## 2018-05-17 DIAGNOSIS — L918 Other hypertrophic disorders of the skin: Secondary | ICD-10-CM | POA: Diagnosis not present

## 2018-05-17 DIAGNOSIS — E119 Type 2 diabetes mellitus without complications: Secondary | ICD-10-CM | POA: Diagnosis not present

## 2018-05-17 DIAGNOSIS — E1169 Type 2 diabetes mellitus with other specified complication: Secondary | ICD-10-CM | POA: Insufficient documentation

## 2018-05-17 DIAGNOSIS — Z0001 Encounter for general adult medical examination with abnormal findings: Secondary | ICD-10-CM

## 2018-05-17 MED ORDER — ATORVASTATIN CALCIUM 40 MG PO TABS: 40.0000 mg | ORAL_TABLET | Freq: Every day | ORAL | 3 refills | Status: DC

## 2018-05-17 MED ORDER — METFORMIN HCL ER 750 MG PO TB24: 750.0000 mg | ORAL_TABLET | Freq: Two times a day (BID) | ORAL | 3 refills | Status: DC

## 2018-05-17 NOTE — Progress Notes (Signed)
Subjective:  James Bautista is a 38 y.o. male who presents today for his annual comprehensive physical exam.    HPI:  He has no acute complaints today.   He has a few skin tags on his neck, arms, and groin that he would like removed.  These been present for several years.  They occasionally become irritated.  His stable, chronic medical conditions are outlined below:  # T2DM -Currently on metformin 1000 mg twice daily.  Tolerating well but has a few episodes of diarrhea - He has weaned himself off insulin since our last visit.  # Demylinating Disease  -Has follow-up with neurology scheduled.  Lifestyle Diet: No specific diets. Tries to limit diets.  Exercise: Does not exercise regularly.   Depression screen PHQ 2/9 02/09/2018  Decreased Interest 0  Down, Depressed, Hopeless 0  PHQ - 2 Score 0    Health Maintenance Due  Topic Date Due  . OPHTHALMOLOGY EXAM  03/29/1993     ROS: Per HPI, otherwise a complete review of systems was negative.   PMH:  The following were reviewed and entered/updated in epic: Past Medical History:  Diagnosis Date  . Allergy    seasonal  . Diabetes mellitus ([place])   . Hyperlipemia   . Kidney stone    never passed a stone or had radiographic evidence of stone.   . Prostatitis 2010   had to wear catheter for urinary retention. [facility] urologist.    Patient Active Problem List   Diagnosis Date Noted  . Dyslipidemia associated with type 2 diabetes mellitus ([place]) 05/17/2018  . 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 Seasonal allergies 10/08/2017  . Obesity 02/09/2013  . Type 2 diabetes mellitus ([place]) 12/06/2010   Past Surgical History:  Procedure Laterality Date  . FEMUR FRACTURE SURGERY  '92   right leg.  [name] LASIK    . WISDOM TOOTH EXTRACTION Bilateral 10/2017    Family History  Problem Relation Age of Onset  . Diabetes Father   . Hyperlipidemia Father   . Goiter Sister   . Cancer Paternal Aunt    ovarian cancer  . Cancer Maternal Grandmother        breast  . COPD Maternal Grandmother   . Heart disease Paternal Grandmother   . Dementia Paternal Grandmother   . Thyroid disease Paternal Grandmother   . Diabetes Paternal Grandfather   . Heart disease Paternal Grandfather   . Multiple sclerosis Neg Hx     Medications- reviewed and updated Current Outpatient Medications  Medication Sig Dispense Refill  . fexofenadine (ALLEGRA) 180 MG tablet Take 180 mg by mouth daily as needed for allergies or rhinitis.    . fluticasone (FLONASE) 50 MCG/ACT nasal spray Place 2 sprays into both nostrils daily as needed for allergies or rhinitis.     [name] atorvastatin (LIPITOR) 40 MG tablet Take 1 tablet (40 mg total) by mouth daily. 90 tablet 3  . metFORMIN (GLUCOPHAGE XR) 750 MG 24 hr tablet Take 1 tablet (750 mg total) by mouth 2 (two) times daily. 180 tablet 3   No current facility-administered medications for this visit.     Allergies-reviewed and updated Allergies  Allergen Reactions  . Neomycin Swelling and Other (See Comments)    Neomycin ear drops - ears swelled shut  . Banana Rash and Other (See Comments)    "Tastes like eating a porcupine"  . Nickel Rash  . Peach [Prunus Persica] Rash and Other (See Comments)    "  Tastes like eating a porcupine"    Social History   Socioeconomic History  . Marital status: Married    Spouse name: James Bautista  . Number of children: 0  . Years of education: 58  . Highest education level: Not on file  Occupational History  . Occupation: Trial Chief Executive Officer  Social Needs  . Financial resource strain: Not on file  . Food insecurity:    Worry: Not on file    Inability: Not on file  . Transportation needs:    Medical: Not on file    Non-medical: Not on file  Tobacco Use  . Smoking status: Never Smoker  . Smokeless tobacco: Never Used  Substance and Sexual Activity  . Alcohol use: No    Alcohol/week: 0.0 standard drinks    Comment: 1 drink per  month  . Drug use: No  . Sexual activity: Yes    Partners: Female  Lifestyle  . Physical activity:    Days per week: 0 days    Minutes per session: 0 min  . Stress: Very much  Relationships  . Social connections:    Talks on phone: Not on file    Gets together: Not on file    Attends religious service: Not on file    Active member of club or organization: Not on file    Attends meetings of clubs or organizations: Not on file    Relationship status: Not on file  Other Topics Concern  . Not on file  Social History Narrative   [place] - [place], [employer] 2010. Married. Uses condoms. 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   Objective:  Physical Exam: BP 112/82 (BP Location: Left Arm, Patient Position: Sitting, Cuff Size: Large)   Pulse 74   Temp 98 F (36.7 C) (Oral)   Ht 6' (1.829 m)   Wt 223 lb 12 oz (101.5 kg)   SpO2 98%   BMI 30.35 kg/m   Body mass index is 30.35 kg/m. Wt Readings from Last 3 Encounters:  05/17/18 223 lb 12 oz (101.5 kg)  04/21/18 225 lb (102.1 kg)  03/25/18 225 lb 6.4 oz (102.2 kg)   Gen: NAD, resting comfortably HEENT: TMs normal bilaterally. OP clear. No thyromegaly noted.  CV: RRR with no murmurs appreciated Pulm: NWOB, CTAB with no crackles, wheezes, or rhonchi GI: Normal bowel sounds present. Soft, Nontender, Nondistended. MSK: no edema, cyanosis, or clubbing noted Skin: 5 skin tags noted at posterior neck, 3 in right axilla, and 1 in left groin.  Neuro: CN2-12 grossly intact. Strength 5/5 in upper and lower extremities. Reflexes symmetric and intact bilaterally.  Psych: Normal affect and thought content  Skin Tag Removal Procedure note Verbal consent was obtained. The skin tags noted above were cleansed with alcohol.  1% lidocaine with epinephrine was then used to anesthetize the skin.  Skin was then prepped with Betadine.  The skin tags were lifted using pickups and snipped at the base using iris scissors.  Areas were  then bandaged.  Patient tolerated the procedure well without complication.  Minimal blood loss.  Assessment/Plan:  Type 2 diabetes mellitus (HCC) Last A1c 7.0.  We will decrease his metformin to 750 mg twice daily given his diarrhea.  Follow-up with me in 3 to 6 months.  Dyslipidemia associated with type 2 diabetes mellitus (HCC) Start Lipitor 40 mg daily.  Discussed potential side effects.  Recheck lipid panel in 1 year.  Skin Tags Removed today.  See  above procedure note.  Preventative Healthcare: Up-to-date on vaccines and screenings.  Patient Counseling(The following topics were reviewed and/or handout was given):  -Nutrition: Stressed importance of moderation in sodium/caffeine intake, saturated fat and cholesterol, caloric balance, sufficient intake of fresh fruits, vegetables, and fiber.  -Stressed the importance of regular exercise.   -Substance Abuse: Discussed cessation/primary prevention of tobacco, alcohol, or other drug use; driving or other dangerous activities under the influence; availability of treatment for abuse.   -Injury prevention: Discussed safety belts, safety helmets, smoke detector, smoking near bedding or upholstery.   -Sexuality: Discussed sexually transmitted diseases, partner selection, use of condoms, avoidance of unintended pregnancy and contraceptive alternatives.   -Dental health: Discussed importance of regular tooth brushing, flossing, and dental visits.  -Health maintenance and immunizations reviewed. Please refer to Health maintenance section.  Return to care in 1 year for next preventative visit.   [name]. Jerline Pain, MD 05/17/2018 10:39 AM

## 2018-05-17 NOTE — Assessment & Plan Note (Signed)
Start Lipitor 40 mg daily.  Discussed potential side effects.  Recheck lipid panel in 1 year.

## 2018-05-17 NOTE — Patient Instructions (Signed)
It was very nice to see you today!  We removed your skin tags today.  Please decrease your metformin to 786m twice daily.  Please start lipitor 452mdaily.  Come back to see me in 3-6 months, or sooner as needed.   Take care, Dr PaJerline Pain Preventive Care 18-39 Years, Male Preventive care refers to lifestyle choices and visits with your health care provider that can promote health and wellness. What does preventive care include?   A yearly physical exam. This is also called an annual well check.  Dental exams once or twice a year.  Routine eye exams. Ask your health care provider how often you should have your eyes checked.  Personal lifestyle choices, including: ? Daily care of your teeth and gums. ? Regular physical activity. ? Eating a healthy diet. ? Avoiding tobacco and drug use. ? Limiting alcohol use. ? Practicing safe sex. What happens during an annual well check? The services and screenings done by your health care provider during your annual well check will depend on your age, overall health, lifestyle risk factors, and family history of disease. Counseling Your health care provider may ask you questions about your:  Alcohol use.  Tobacco use.  Drug use.  Emotional well-being.  Home and relationship well-being.  Sexual activity.  Eating habits.  Work and work enStatisticianScreening You may have the following tests or measurements:  Height, weight, and BMI.  Blood pressure.  Lipid and cholesterol levels. These may be checked every 5 years starting at age 36 Diabetes screening. This is done by checking your blood sugar (glucose) after you have not eaten for a while (fasting).  Skin check.  Hepatitis C blood test.  Hepatitis B blood test.  Sexually transmitted disease (STD) testing. Discuss your test results, treatment options, and if necessary, the need for more tests with your health care provider. Vaccines Your health care provider  may recommend certain vaccines, such as:  Influenza vaccine. This is recommended every year.  Tetanus, diphtheria, and acellular pertussis (Tdap, Td) vaccine. You may need a Td booster every 10 years.  Varicella vaccine. You may need this if you have not been vaccinated.  HPV vaccine. If you are 36 younger, you may need three doses over 6 months.  Measles, mumps, and rubella (MMR) vaccine. You may need at least one dose of MMR.You may also need a second dose.  Pneumococcal 13-valent conjugate (PCV13) vaccine. You may need this if you have certain conditions and have not been vaccinated.  Pneumococcal polysaccharide (PPSV23) vaccine. You may need one or two doses if you smoke cigarettes or if you have certain conditions.  Meningococcal vaccine. One dose is recommended if you are age 23-21 years and a first-year college student living in a residence hall, or if you have one of several medical conditions. You may also need additional booster doses.  Hepatitis A vaccine. You may need this if you have certain conditions or if you travel or work in places where you may be exposed to hepatitis A.  Hepatitis B vaccine. You may need this if you have certain conditions or if you travel or work in places where you may be exposed to hepatitis B.  Haemophilus influenzae type b (Hib) vaccine. You may need this if you have certain risk factors. Talk to your health care provider about which screenings and vaccines you need and how often you need them. This information is not intended to replace advice given to you by  your health care provider. Make sure you discuss any questions you have with your health care provider. Document Released: 06/24/2001 Document Revised: 12/09/2016 Document Reviewed: 02/27/2015 Elsevier Interactive Patient Education  2019 [employer].

## 2018-05-17 NOTE — Assessment & Plan Note (Signed)
Last A1c 7.0.  We will decrease his metformin to 750 mg twice daily given his diarrhea.  Follow-up with me in 3 to 6 months.

## 2018-05-19 NOTE — Telephone Encounter (Signed)
[place]: [id number] (exp. 05/19/18 to 07/03/18) patient is scheduled at [facility] [address] for 06/02/18

## 2018-06-02 ENCOUNTER — Ambulatory Visit: Payer: 59

## 2018-06-02 DIAGNOSIS — G379 Demyelinating disease of central nervous system, unspecified: Secondary | ICD-10-CM

## 2018-06-02 MED ORDER — GADOBENATE DIMEGLUMINE 529 MG/ML IV SOLN
20.0000 mL | Freq: Once | INTRAVENOUS | Status: AC | PRN
  Administered 2018-06-02: 20 mL via INTRAVENOUS

## 2018-06-04 ENCOUNTER — Telehealth: Payer: Self-pay | Admitting: Neurology

## 2018-06-04 NOTE — Telephone Encounter (Signed)
I spoke to James Bautista about the MRI.  The right frontal lesion in the juxtacortical white matter no longer enhances and has less edema associated with it.  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  However, with a single lesion diagnosing MS with certainty is more difficult.  He will follow-up in a couple weeks with me and we will recheck another MRI later this year

## 2018-06-15 ENCOUNTER — Ambulatory Visit: Payer: 59 | Admitting: Neurology

## 2018-06-15 ENCOUNTER — Encounter: Payer: Self-pay | Admitting: Neurology

## 2018-06-15 VITALS — BP 150/97 | HR 100 | Ht 72.0 in | Wt 224.0 lb

## 2018-06-15 DIAGNOSIS — G379 Demyelinating disease of central nervous system, unspecified: Secondary | ICD-10-CM

## 2018-06-15 DIAGNOSIS — E119 Type 2 diabetes mellitus without complications: Secondary | ICD-10-CM | POA: Diagnosis not present

## 2018-06-15 NOTE — Progress Notes (Signed)
[facility]  PATIENT: James Bautista DOB: 01/15/1981  REFERRING DOCTOR OR PCP:  Dimas Chyle, MD SOURCE: Patient, notes from hospital admission, imaging and laboratory reports, MRI images personally reviewed.  _________________________________   HISTORICAL  CHIEF COMPLAINT:  Chief Complaint  Patient presents with  . Follow-up    RM 13 with [name].  Last seen 04/21/18. Doing well, no new sx.    HISTORY OF PRESENT ILLNESS:  James Bautista is a 38 yo man who presented with slurred speech and abnormal brain MRI 03/2018.   Update 06/15/2018: He is now feeling back to baseline.  Specifically, coordination is normal in the left hand.  The numbness has resolved.  Speech is normal.  I reviewed his recent brain MRI performed January 2020 in the presence of him and his wife.  It shows that the large right frontal lobe focus no longer enhances and there is reduced edema.   We had a long discussion about the significance of the studies to date.  He had a vaccination about 2 weeks before symptoms started (vaccination)   discussed that he might possibly have a post vaccination monophasic CNS event.  However, oligoclonal bands were present and he could also have a tumefactive lesion associated with MS.  He has DM controlled on metformin.      From 04/21/2018:  He is a 38 year old man who was at his baseline until mid October 2019.  At that time, he noted that he started biting the inside of his mouth.   He had a dental appointment which was non-contributary.   He continued to have some inner cheek biting the next couple weeks.   On 03/14/2018, he had the onset of left facial dysesthesias.   He noticed a sinus stuffy sensation.    While speaking, he noticed his voice was slurred.   He works as a Chief Executive Officer.  The next day, while looking in a mirror, he noted his smile was asymmetric, weaker on the left.   Additionally, speech was more slurred.  He presented to the [facility] ED 03/15/2018.   CT  scan showed a region of edema in the posterior right frontal lobe.[name]   His brain MRI showed a large right subcortical focus with patchy enhancement and restriction on DWI.     He was admitted and had 5 days of IV Solu-Medrol.  Every day, he noted some improvement.   By discharge, slurred speech was much better but there was still some facial > hand numbness.    He did have some problems with left hand coordination for a few weeks, now nearly completely resolved.   His smile is now nearly symmetric    He has never had any other neurologic symptom lasting more than a few minutes.  He has never noted any significant visual symptom.     He has essential hypertension and diabetes mellitus.  He had the flu-shot 02/09/2018.   The vaccination was Fluarix quadrivalent Lot [id number]  Exp 11/09/2018   [place] [id number].    No antecedent infections.  I personally reviewed the MRI of the brain 03/15/2018 and MRI of the cervical thoracic spine 03/16/2018.  The MRI of the brain shows a juxtacortical/subcortical large T2/FLAIR hyperintense focus that also showed on diffusion-weighted images.  There was some peripheral enhancement incompletely.  There were no other lesions in the brain . MRI of the spine did not show any additional lesions.  There is no spinal cord compression though he has mild spinal stenosis at  C4-C5.  REVIEW OF SYSTEMS: Constitutional: No fevers, chills, sweats, or change in appetite Eyes: No visual changes, double vision, eye pain Ear, nose and throat: No hearing loss, ear pain, nasal congestion, sore throat Cardiovascular: No chest pain, palpitations Respiratory: No shortness of breath at rest or with exertion.   No wheezes GastrointestinaI: No nausea, vomiting, diarrhea, abdominal pain, fecal incontinence Genitourinary: No dysuria, urinary retention or frequency.  No nocturia. Musculoskeletal: No neck pain, back pain Integumentary: No rash, pruritus, skin lesions Neurological: as  above Psychiatric: No depression at this time.  No anxiety Endocrine: He has diabetes and is on metformin and insulin.  Hematologic/Lymphatic: No anemia, purpura, petechiae. Allergic/Immunologic: No itchy/runny eyes, nasal congestion, recent allergic reactions, rashes  ALLERGIES: Allergies  Allergen Reactions  . Neomycin Swelling and Other (See Comments)    Neomycin ear drops - ears swelled shut  . Banana Rash and Other (See Comments)    "Tastes like eating a porcupine"  . Nickel Rash  . Peach [Prunus Persica] Rash and Other (See Comments)    "Tastes like eating a porcupine"    HOME MEDICATIONS:  Current Outpatient Medications:  .  atorvastatin (LIPITOR) 40 MG tablet, Take 1 tablet (40 mg total) by mouth daily., Disp: 90 tablet, Rfl: 3 .  fexofenadine (ALLEGRA) 180 MG tablet, Take 180 mg by mouth daily as needed for allergies or rhinitis., Disp: , Rfl:  .  fluticasone (FLONASE) 50 MCG/ACT nasal spray, Place 2 sprays into both nostrils daily as needed for allergies or rhinitis. , Disp: , Rfl:  .  metFORMIN (GLUCOPHAGE XR) 750 MG 24 hr tablet, Take 1 tablet (750 mg total) by mouth 2 (two) times daily., Disp: 180 tablet, Rfl: 3  PAST MEDICAL HISTORY: Past Medical History:  Diagnosis Date  . Allergy    seasonal  . Diabetes mellitus ([place])   . Hyperlipemia   . Kidney stone    never passed a stone or had radiographic evidence of stone.   . Prostatitis 2010   had to wear catheter for urinary retention. [facility] urologist.     PAST SURGICAL HISTORY: Past Surgical History:  Procedure Laterality Date  . FEMUR FRACTURE SURGERY  '92   right leg.  [name] LASIK    . WISDOM TOOTH EXTRACTION Bilateral 10/2017    FAMILY HISTORY: Family History  Problem Relation Age of Onset  . Diabetes Father   . Hyperlipidemia Father   . Goiter Sister   . Cancer Paternal Aunt        ovarian cancer  . Cancer Maternal Grandmother        breast  . COPD Maternal Grandmother   . Heart disease Paternal  Grandmother   . Dementia Paternal Grandmother   . Thyroid disease Paternal Grandmother   . Diabetes Paternal Grandfather   . Heart disease Paternal Grandfather   . Multiple sclerosis Neg Hx     SOCIAL HISTORY:  Social History   Socioeconomic History  . Marital status: Married    Spouse name: Elliott Lasecki  . Number of children: 0  . Years of education: 38  . Highest education level: Not on file  Occupational History  . Occupation: Trial Chief Executive Officer  Social Needs  . Financial resource strain: Not on file  . Food insecurity:    Worry: Not on file    Inability: Not on file  . Transportation needs:    Medical: Not on file    Non-medical: Not on file  Tobacco Use  . Smoking status: Never Smoker  .  Smokeless tobacco: Never Used  Substance and Sexual Activity  . Alcohol use: No    Alcohol/week: 0.0 standard drinks    Comment: 1 drink per month  . Drug use: No  . Sexual activity: Yes    Partners: Female  Lifestyle  . Physical activity:    Days per week: 0 days    Minutes per session: 0 min  . Stress: Very much  Relationships  . Social connections:    Talks on phone: Not on file    Gets together: Not on file    Attends religious service: Not on file    Active member of club or organization: Not on file    Attends meetings of clubs or organizations: Not on file    Relationship status: Not on file  . Intimate partner violence:    Fear of current or ex partner: Not on file    Emotionally abused: Not on file    Physically abused: Not on file    Forced sexual activity: Not on file  Other Topics Concern  . Not on file  Social History Narrative   [place], [employer] 2010. Married. Uses condoms. 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     PHYSICAL EXAM  Vitals:   06/15/18 1515  BP: (!) 150/97  Pulse: 100  Weight: 224 lb (101.6 kg)  Height: 6' (1.829 m)    Body mass index is 30.38 kg/m.   General: The patient is well-developed and  well-nourished and in no acute distress  Neurologic Exam  Mental status: The patient is alert and oriented x 3 at the time of the examination. The patient has apparent normal recent and remote memory, with an apparently normal attention span and concentration ability.   Speech is normal.  Cranial nerves: Extraocular movements are full.  Facial strength and sensation is normal.  Trapezius strength is normal.  No dysarthria.  The tongue is midline, and the patient has symmetric elevation of the soft palate. No obvious hearing deficits are noted.  Motor:  Muscle bulk is normal.   Tone is normal. Strength is  5 / 5 in all 4 extremities.   Sensory: Intact sensation to touch and vibration.  Coordination: Cerebellar testing reveals good finger-nose-finger and heel-to-shin bilaterally Gait and station: Station is normal.   Gait is normal. Tandem gait is normal. Romberg is negative.   Reflexes: Deep tendon reflexes are symmetric and normal bilaterally.   Plantar responses are flexor.    DIAGNOSTIC DATA (LABS, IMAGING, TESTING) - I reviewed patient records, labs, notes, testing and imaging myself where available.  Lab Results  Component Value Date   WBC 9.1 04/29/2018   HGB 15.9 04/29/2018   HCT 47.1 04/29/2018   MCV 77.9 (L) 04/29/2018   PLT 186.0 04/29/2018      Component Value Date/Time   NA 140 04/29/2018 1339   K 4.0 04/29/2018 1339   CL 101 04/29/2018 1339   CO2 30 04/29/2018 1339   GLUCOSE 82 04/29/2018 1339   BUN 13 04/29/2018 1339   CREATININE 0.88 04/29/2018 1339   CALCIUM 9.4 04/29/2018 1339   PROT 7.0 04/29/2018 1339   ALBUMIN 4.6 04/29/2018 1339   ALBUMIN 4.3 03/17/2018 1632   AST 19 04/29/2018 1339   ALT 39 04/29/2018 1339   ALKPHOS 82 04/29/2018 1339   BILITOT 0.5 04/29/2018 1339   GFRNONAA >60 03/18/2018 0557   GFRAA >60 03/18/2018 0557   Lab Results  Component Value  Date   CHOL 151 04/29/2018   HDL 31.30 (L) 04/29/2018   LDLCALC 69 12/29/2013   LDLDIRECT  99.0 04/29/2018   TRIG 232.0 (H) 04/29/2018   CHOLHDL 5 04/29/2018   Lab Results  Component Value Date   HGBA1C 7.0 (H) 04/29/2018   No results found for: VITAMINB12 Lab Results  Component Value Date   TSH 3.32 12/29/2013       ASSESSMENT AND PLAN  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    He has a single demyelinating event.  The 2 most likely causes of this would be either a postvaccination syndrome or early multiple sclerosis with a tumefactive lesion.  We will recheck another MRI over the summer and I will see him a week or 2 after that to go over the results.  If he has new lesions during the interim, then MS would be much more likely.  Otherwise, a postvaccination etiology would be more likely.  If no changes, we would need to consider 1 more follow-up MRI in 9 to 12 months later 2.    He will return to see me in 6 months or sooner if there are new or worsening neurologic symptoms.  Ellizabeth Dacruz A. Felecia Shelling, MD, [facility] 05/13/1971, 8:83 PM Certified in Neurology, Clinical Neurophysiology, Sleep Medicine, Pain Medicine and Neuroimaging  [facility] [address] [place], [place] [address] [phone]

## 2018-06-17 ENCOUNTER — Telehealth: Payer: Self-pay | Admitting: Neurology

## 2018-06-17 NOTE — Telephone Encounter (Signed)
LVM for pt to call me back about scheduling his MRI in July

## 2018-11-09 ENCOUNTER — Encounter: Payer: Self-pay | Admitting: Family Medicine

## 2018-11-10 ENCOUNTER — Ambulatory Visit (INDEPENDENT_AMBULATORY_CARE_PROVIDER_SITE_OTHER): Payer: 59 | Admitting: Family Medicine

## 2018-11-10 DIAGNOSIS — E119 Type 2 diabetes mellitus without complications: Secondary | ICD-10-CM | POA: Diagnosis not present

## 2018-11-10 DIAGNOSIS — R197 Diarrhea, unspecified: Secondary | ICD-10-CM | POA: Diagnosis not present

## 2018-11-10 DIAGNOSIS — J302 Other seasonal allergic rhinitis: Secondary | ICD-10-CM

## 2018-11-10 NOTE — Progress Notes (Signed)
    Chief Complaint:  James Bautista is a 38 y.o. male who presents today for a virtual office visit with a chief complaint of diarrhea.   Assessment/Plan:  Diarrhea/seasonal allergies No red flags.  Discussed possibility with patient this could likely represent COVID-19 infection however given upcoming long holiday weekend and turnaround time for testing, is of little utility to have testing done at this point.  Advised him to self quarantine for at least 7 days and 3 days without symptoms.  He voiced understanding.  Discussed reasons to return to care and seek emergent care.  He will continue taking over-the-counter meds as needed for his allergies.  T2DM States metformin is causing some GI issues.  We will follow-up with me in a few weeks to have A1c checked.  May need to decrease dose of metformin at that time depending on results.    Subjective:  HPI:  Diarrhea Started 3-4 days ago.  Associated with nausea and malaise.  The symptoms subsided however has had rhinorrhea for the past couple of days.  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  No fevers or chills.  No reported chest pain or shortness of breath.  He has been taking Allegra for his allergies.  Also takes Flonase for allergies.  No other obvious alleviating or aggravating factors.   ROS: Per HPI  PMH: He reports that he has never smoked. He has never used smokeless tobacco. He reports that he does not drink alcohol or use drugs.      Objective/Observations  Physical Exam: Gen: NAD, resting comfortably Pulm: Normal work of breathing Neuro: Grossly normal, moves all extremities Psych: Normal affect and thought content  Virtual Visit via Video   I connected with Hart Carwin on 11/10/18 at  9:20 AM EDT by a video enabled telemedicine application and verified that I am speaking with the correct person using two identifiers. I discussed the  limitations of evaluation and management by telemedicine and the availability of in person appointments. The patient expressed understanding and agreed to proceed.   Patient location: Home Provider location: [place] participating in the virtual visit: Myself and Patient     [name]. Jerline Pain, MD 11/10/2018 8:26 AM

## 2018-11-29 NOTE — Telephone Encounter (Signed)
no to the covid-19 questions MR Brain w/wo contrast Dr. Felecia Shelling [facility] Auth: [id number] (exp. 11/29/18 to 01/13/19). Patient is scheduled for 12/01/18.

## 2018-12-01 ENCOUNTER — Telehealth: Payer: Self-pay | Admitting: *Deleted

## 2018-12-01 ENCOUNTER — Other Ambulatory Visit: Payer: Self-pay

## 2018-12-01 ENCOUNTER — Ambulatory Visit: Payer: 59

## 2018-12-01 DIAGNOSIS — G379 Demyelinating disease of central nervous system, unspecified: Secondary | ICD-10-CM | POA: Diagnosis not present

## 2018-12-01 MED ORDER — GADOBENATE DIMEGLUMINE 529 MG/ML IV SOLN
20.0000 mL | Freq: Once | INTRAVENOUS | Status: AC | PRN
  Administered 2018-12-01: 20 mL via INTRAVENOUS

## 2018-12-01 NOTE — Telephone Encounter (Signed)
-----   Message from Britt Bottom, MD sent at 12/01/2018  1:28 PM EDT ----- Please let the patient know that the MRI looks good.   No new lesions and the large old one is slightly smaller

## 2018-12-03 ENCOUNTER — Ambulatory Visit: Payer: 59 | Admitting: Family Medicine

## 2018-12-20 ENCOUNTER — Telehealth: Payer: Self-pay

## 2018-12-20 NOTE — Telephone Encounter (Signed)
-----   Message from Micheline Chapman sent at 12/16/2018  9:55 AM EDT ----- Regarding: Pt Asking About Lab Work Pt is scheduled for an appt on 8/12. IF Dr. Jerline Pain is going to want any lab work done, the pt is requesting for it to be done in advance of his appt so he can discuss results with Dr. Jerline Pain during his visit. Please advise. Thank you!

## 2018-12-20 NOTE — Telephone Encounter (Signed)
Spoke to pt and advised that he will not need any labs for 6 months f/u unless something comes up at visit. Pt was informed that a POC A1c may be obtain but only takes 5 min to run. Pt verbalized understanding. No further action needed!

## 2018-12-22 ENCOUNTER — Encounter: Payer: Self-pay | Admitting: Family Medicine

## 2018-12-22 ENCOUNTER — Ambulatory Visit: Payer: 59 | Admitting: Family Medicine

## 2018-12-22 ENCOUNTER — Other Ambulatory Visit: Payer: Self-pay

## 2018-12-22 VITALS — Temp 97.1°F | Ht 72.0 in | Wt 213.2 lb

## 2018-12-22 DIAGNOSIS — E785 Hyperlipidemia, unspecified: Secondary | ICD-10-CM | POA: Diagnosis not present

## 2018-12-22 DIAGNOSIS — E1169 Type 2 diabetes mellitus with other specified complication: Secondary | ICD-10-CM

## 2018-12-22 DIAGNOSIS — J302 Other seasonal allergic rhinitis: Secondary | ICD-10-CM | POA: Diagnosis not present

## 2018-12-22 DIAGNOSIS — E119 Type 2 diabetes mellitus without complications: Secondary | ICD-10-CM | POA: Diagnosis not present

## 2018-12-22 LAB — POCT GLYCOSYLATED HEMOGLOBIN (HGB A1C): Hemoglobin A1C: 5.4 % (ref 4.0–5.6)

## 2018-12-22 MED ORDER — METFORMIN HCL ER 500 MG PO TB24: 500.0000 mg | ORAL_TABLET | Freq: Every day | ORAL | 3 refills | Status: DC
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
## 2018-12-22 NOTE — Assessment & Plan Note (Signed)
Stable.  Continue Allegra and Flonase as needed seasonally.

## 2018-12-22 NOTE — Assessment & Plan Note (Signed)
ANC improved to 5.4.  Will decrease metformin to 500 mg daily.  Follow-up in 3 to 6 months.

## 2018-12-22 NOTE — Assessment & Plan Note (Signed)
Continue Lipitor 40 mg daily.  Check lipid panel next blood draw.

## 2018-12-22 NOTE — Patient Instructions (Signed)
It was very nice to see you today!  Your A1c looks great! Please decreased metformin to 500mg  daily  Come back early next year for your physical, or sooner if needed.   Take care, Dr Jerline Pain  Please try these tips to maintain a healthy lifestyle:   Eat at least 3 REAL meals and 1-2 snacks per day.  Aim for no more than 5 hours between eating.  If you eat breakfast, please do so within one hour of getting up.    Obtain twice as many fruits/vegetables as protein or carbohydrate foods for both lunch and dinner. (Half of each meal should be fruits/vegetables, one quarter protein, and one quarter starchy carbs)   Cut down on sweet beverages. This includes juice, soda, and sweet tea.    Exercise at least 150 minutes every week.

## 2018-12-23 ENCOUNTER — Ambulatory Visit: Payer: 59 | Admitting: Neurology

## 2018-12-23 VITALS — BP 146/90 | HR 80 | Temp 97.3°F | Ht 72.0 in | Wt 216.5 lb

## 2018-12-23 DIAGNOSIS — R4781 Slurred speech: Secondary | ICD-10-CM | POA: Diagnosis not present

## 2018-12-23 DIAGNOSIS — E119 Type 2 diabetes mellitus without complications: Secondary | ICD-10-CM

## 2018-12-23 DIAGNOSIS — G379 Demyelinating disease of central nervous system, unspecified: Secondary | ICD-10-CM

## 2018-12-23 NOTE — Progress Notes (Signed)
[facility]  PATIENT: James Bautista DOB: 21-Jun-1980  REFERRING DOCTOR OR PCP:  Dimas Chyle, MD SOURCE: Patient, notes from hospital admission, imaging and laboratory reports, MRI images personally reviewed.  _________________________________   HISTORICAL  CHIEF COMPLAINT:  Chief Complaint  Patient presents with  . Follow-up    RM 12, alone. Doing well, no new sx. Last seen 06/15/2018. Most recent MRI showed no new lesions. He is requesting to facetime with wife during appt.    HISTORY OF PRESENT ILLNESS:  James Bautista is a 38 yo man who presented with slurred speech and abnormal brain MRI 03/2018.   Update 12/23/2018: James Bautista continues to do well.  He is practically back to baseline.  There is no more slurred speech and no more hand coordination issues.  He occasionally notes very mild left lip weakness.  We repeated the MRI of the brain 12/01/2018.  It looks very good.  The dominant lesion in the posterior right frontal lobe is either the same or slightly smaller than the previous MRI and clearly better than the November 2019 MRI.  I had a long discussion with him (and his wife on face time).  We went over the differential diagnosis: Post vaccination monophasic demyelinating event, early tumefactive MS, neoplastic process.  The fact that the enhancement has resolved and the lesion is smaller, makes a neoplastic process much less likely.  As time goes by, if there are no new lesions, MS becomes less likely and a postvaccination event becomes more likely.  He denies any problems with his gait though he did have a recent fall injuring himself mildly.  Strength, sensation, bladder function and vision are fine.  Cognition is fine.  Update 06/15/2018: He is now feeling back to baseline.  Specifically, coordination is normal in the left hand.  The numbness has resolved.  Speech is normal.  I reviewed his recent brain MRI performed January 2020 in the presence of him and his  wife.  It shows that the large right frontal lobe focus no longer enhances and there is reduced edema.   We had a long discussion about the significance of the studies to date.  He had a vaccination about 2 weeks before symptoms started (vaccination)   discussed that he might possibly have a post vaccination monophasic CNS event.  However, oligoclonal bands were present and he could also have a tumefactive lesion associated with MS.  He has DM controlled on metformin.      From 04/21/2018:  He is a 38 year old man who was at his baseline until mid October 2019.  At that time, he noted that he started biting the inside of his mouth.   He had a dental appointment which was non-contributary.   He continued to have some inner cheek biting the next couple weeks.   On 03/14/2018, he had the onset of left facial dysesthesias.   He noticed a sinus stuffy sensation.    While speaking, he noticed his voice was slurred.   He works as a Chief Executive Officer.  The next day, while looking in a mirror, he noted his smile was asymmetric, weaker on the left.   Additionally, speech was more slurred.  [facility].   CT scan showed a region of edema in the posterior right frontal lobe.[name]   His brain MRI showed a large right subcortical focus with patchy enhancement and restriction on DWI.     He was admitted and had 5 days of  IV Solu-Medrol.  Every day, he noted some improvement.   By discharge, slurred speech was much better but there was still some facial > hand numbness.    He did have some problems with left hand coordination for a few weeks, now nearly completely resolved.   His smile is now nearly symmetric    He has never had any other neurologic symptom lasting more than a few minutes.  He has never noted any significant visual symptom.     He has essential hypertension and diabetes mellitus.  He had the flu-shot 02/09/2018.   The vaccination was Fluarix quadrivalent Lot [id number]  Exp 11/09/2018   [place]  22025-427-06.    No antecedent infections.  I personally reviewed the MRI of the brain 03/15/2018 and MRI of the cervical thoracic spine 03/16/2018.  The MRI of the brain shows a juxtacortical/subcortical large T2/FLAIR hyperintense focus that also showed on diffusion-weighted images.  There was some peripheral enhancement incompletely.  There were no other lesions in the brain . MRI of the spine did not show any additional lesions.  There is no spinal cord compression though he has mild spinal stenosis at C4-C5.  REVIEW OF SYSTEMS: Constitutional: No fevers, chills, sweats, or change in appetite Eyes: No visual changes, double vision, eye pain Ear, nose and throat: No hearing loss, ear pain, nasal congestion, sore throat Cardiovascular: No chest pain, palpitations Respiratory: No shortness of breath at rest or with exertion.   No wheezes GastrointestinaI: No nausea, vomiting, diarrhea, abdominal pain, fecal incontinence Genitourinary: No dysuria, urinary retention or frequency.  No nocturia. Musculoskeletal: No neck pain, back pain Integumentary: No rash, pruritus, skin lesions Neurological: as above Psychiatric: No depression at this time.  No anxiety Endocrine: He has diabetes and is on metformin and insulin.  Hematologic/Lymphatic: No anemia, purpura, petechiae. Allergic/Immunologic: No itchy/runny eyes, nasal congestion, recent allergic reactions, rashes  ALLERGIES: Allergies  Allergen Reactions  . Neomycin Swelling and Other (See Comments)    Neomycin ear drops - ears swelled shut  . Banana Rash and Other (See Comments)    "Tastes like eating a porcupine"  . Nickel Rash  . Peach [Prunus Persica] Rash and Other (See Comments)    "Tastes like eating a porcupine"    HOME MEDICATIONS:  Current Outpatient Medications:  .  atorvastatin (LIPITOR) 40 MG tablet, Take 1 tablet (40 mg total) by mouth daily., Disp: 90 tablet, Rfl: 3 .  fexofenadine (ALLEGRA) 180 MG tablet, Take 180 mg  by mouth daily as needed for allergies or rhinitis., Disp: , Rfl:  .  fluticasone (FLONASE) 50 MCG/ACT nasal spray, Place 2 sprays into both nostrils daily as needed for allergies or rhinitis. , Disp: , Rfl:  .  metFORMIN (GLUCOPHAGE XR) 500 MG 24 hr tablet, Take 1 tablet (500 mg total) by mouth daily with breakfast., Disp: 90 tablet, Rfl: 3  PAST MEDICAL HISTORY: Past Medical History:  Diagnosis Date  . Allergy    seasonal  . Diabetes mellitus ([place])   . Hyperlipemia   . Kidney stone    never passed a stone or had radiographic evidence of stone.   . Prostatitis 2010   had to wear catheter for urinary retention. [facility] urologist.     PAST SURGICAL HISTORY: Past Surgical History:  Procedure Laterality Date  . FEMUR FRACTURE SURGERY  '92   right leg.  [name] LASIK    . WISDOM TOOTH EXTRACTION Bilateral 10/2017    FAMILY HISTORY: Family History  Problem Relation Age of  Onset  . Diabetes Father   . Hyperlipidemia Father   . Goiter Sister   . Cancer Paternal Aunt        ovarian cancer  . Cancer Maternal Grandmother        breast  . COPD Maternal Grandmother   . Heart disease Paternal Grandmother   . Dementia Paternal Grandmother   . Thyroid disease Paternal Grandmother   . Diabetes Paternal Grandfather   . Heart disease Paternal Grandfather   . Multiple sclerosis Neg Hx     SOCIAL HISTORY:  Social History   Socioeconomic History  . Marital status: Married    Spouse name: Leigh Kaeding  . Number of children: 0  . Years of education: 20  . Highest education level: Not on file  Occupational History  . Occupation: Trial Chief Executive Officer  Social Needs  . Financial resource strain: Not on file  . Food insecurity    Worry: Not on file    Inability: Not on file  . Transportation needs    Medical: Not on file    Non-medical: Not on file  Tobacco Use  . Smoking status: Never Smoker  . Smokeless tobacco: Never Used  Substance and Sexual Activity  . Alcohol use: No    Alcohol/week:  0.0 standard drinks    Comment: 1 drink per month  . Drug use: No  . Sexual activity: Yes    Partners: Female  Lifestyle  . Physical activity    Days per week: 0 days    Minutes per session: 0 min  . Stress: Very much  Relationships  . Social Herbalist on phone: Not on file    Gets together: Not on file    Attends religious service: Not on file    Active member of club or organization: Not on file    Attends meetings of clubs or organizations: Not on file    Relationship status: Not on file  . Intimate partner violence    Fear of current or ex partner: Not on file    Emotionally abused: Not on file    Physically abused: Not on file    Forced sexual activity: Not on file  Other Topics Concern  . Not on file  Social History Narrative   [place] - [place], [employer] 2010. Married. Uses condoms. 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     PHYSICAL EXAM  Vitals:   12/23/18 0832  BP: (!) 146/90  Pulse: 80  Temp: (!) 97.3 F (36.3 C)  Weight: 216 lb 8 oz (98.2 kg)  Height: 6' (1.829 m)    Body mass index is 29.36 kg/m.   General: The patient is well-developed and well-nourished and in no acute distress  Neurologic Exam  Mental status: The patient is alert and oriented x 3 at the time of the examination. The patient has apparent normal recent and remote memory, with an apparently normal attention span and concentration ability.   Speech is normal.  Cranial nerves: Extraocular movements are full.  Facial strength and sensation is normal.  Trapezius strength is normal.  No dysarthria.  The tongue is midline, and the patient has symmetric elevation of the soft palate. No obvious hearing deficits are noted.  Motor:  Muscle bulk is normal.   Tone is normal. Strength is  5 / 5 in all 4 extremities.   Sensory: Intact sensation to touch and vibration.  Coordination: Cerebellar testing shows good finger-nose-finger and  heel-to-shin  Gait and  station: Station is normal.   Gait and tandem gait are normal.  Romberg is negative.   Reflexes: Deep tendon reflexes are symmetric and normal bilaterally.        ASSESSMENT AND PLAN    1. 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 Type 2 diabetes mellitus without complication, without long-term current use of insulin ([place])   3. Slurred speech      1.    He is stable and the last MRI of the brain did not show any new lesions.  The right posterior frontal subcortical white matter focus has not increased further in size.  It does not enhance anymore.  Although the etiology is uncertain, the 2 most likely causes of this would be either a postvaccination syndrome or early multiple sclerosis with a tumefactive lesion.  We will recheck another MRI in 6 to 7 months.  We have discussed that if this does develop further to be consistent with MS that we would start a disease modifying therapy.  2.    He will return to see me in 6 months or so, a week or 2 after the next MRI.  He should call sooner if there are new or worsening neurologic symptoms.  Richard A. Felecia Shelling, MD, [facility] 2/44/6950, 7:22 PM Certified in Neurology, Clinical Neurophysiology, Sleep Medicine, Pain Medicine and Neuroimaging  [facility] [address], [place], [place] [address] [phone]

## 2019-03-12 ENCOUNTER — Other Ambulatory Visit: Payer: Self-pay | Admitting: Family Medicine

## 2019-05-17 ENCOUNTER — Encounter: Payer: Self-pay | Admitting: Family Medicine

## 2019-05-18 NOTE — Telephone Encounter (Signed)
Dr. Jerline Pain, please see pt's response and he has an upcoming appt.

## 2019-05-19 ENCOUNTER — Encounter: Payer: Self-pay | Admitting: Family Medicine

## 2019-05-19 ENCOUNTER — Ambulatory Visit (INDEPENDENT_AMBULATORY_CARE_PROVIDER_SITE_OTHER): Payer: 59 | Admitting: Family Medicine

## 2019-05-19 ENCOUNTER — Other Ambulatory Visit: Payer: Self-pay

## 2019-05-19 VITALS — BP 144/88 | HR 75 | Temp 98.0°F | Ht 72.0 in | Wt 231.5 lb

## 2019-05-19 DIAGNOSIS — F439 Reaction to severe stress, unspecified: Secondary | ICD-10-CM | POA: Diagnosis not present

## 2019-05-19 DIAGNOSIS — L739 Follicular disorder, unspecified: Secondary | ICD-10-CM | POA: Diagnosis not present

## 2019-05-19 DIAGNOSIS — G379 Demyelinating disease of central nervous system, unspecified: Secondary | ICD-10-CM

## 2019-05-19 DIAGNOSIS — E785 Hyperlipidemia, unspecified: Secondary | ICD-10-CM | POA: Diagnosis not present

## 2019-05-19 DIAGNOSIS — Z0001 Encounter for general adult medical examination with abnormal findings: Secondary | ICD-10-CM

## 2019-05-19 DIAGNOSIS — E119 Type 2 diabetes mellitus without complications: Secondary | ICD-10-CM

## 2019-05-19 DIAGNOSIS — E1169 Type 2 diabetes mellitus with other specified complication: Secondary | ICD-10-CM

## 2019-05-19 LAB — COMPREHENSIVE METABOLIC PANEL
ALT: 61 U/L — ABNORMAL HIGH (ref 0–53)
AST: 22 U/L (ref 0–37)
Albumin: 4.4 g/dL (ref 3.5–5.2)
Alkaline Phosphatase: 109 U/L (ref 39–117)
BUN: 14 mg/dL (ref 6–23)
CO2: 31 mEq/L (ref 19–32)
Calcium: 9.4 mg/dL (ref 8.4–10.5)
Chloride: 99 mEq/L (ref 96–112)
Creatinine, Ser: 0.99 mg/dL (ref 0.40–1.50)
GFR: 85.47 mL/min (ref >60.00)
Glucose, Bld: 207 mg/dL — ABNORMAL HIGH (ref 70–99)
Potassium: 4.4 mEq/L (ref 3.5–5.1)
Sodium: 138 mEq/L (ref 135–145)
Total Bilirubin: 0.6 mg/dL (ref 0.2–1.2)
Total Protein: 6.9 g/dL (ref 6.0–8.3)

## 2019-05-19 LAB — CBC
HCT: 47.6 % (ref 39.0–52.0)
Hemoglobin: 16.2 g/dL (ref 13.0–17.0)
MCHC: 33.9 g/dL (ref 30.0–36.0)
MCV: 77.6 fl — ABNORMAL LOW (ref 78.0–100.0)
Platelets: 164 10*3/uL (ref 150.0–400.0)
RBC: 6.14 Mil/uL — ABNORMAL HIGH (ref 4.22–5.81)
RDW: 15.3 % (ref 11.5–15.5)
WBC: 8.7 10*3/uL (ref 4.0–10.5)

## 2019-05-19 LAB — HEMOGLOBIN A1C: Hgb A1c MFr Bld: 6.7 % — ABNORMAL HIGH (ref 4.6–6.5)

## 2019-05-19 LAB — LIPID PANEL
Cholesterol: 117 mg/dL (ref 0–200)
HDL: 32.7 mg/dL — ABNORMAL LOW (ref >39.00)
NonHDL: 84.22
Total CHOL/HDL Ratio: 4
Triglycerides: 274 mg/dL — ABNORMAL HIGH (ref 0.0–149.0)
VLDL: 54.8 mg/dL — ABNORMAL HIGH (ref 0.0–40.0)

## 2019-05-19 LAB — MICROALBUMIN / CREATININE URINE RATIO
Creatinine,U: 152.2 mg/dL
Microalb Creat Ratio: 6.5 mg/g (ref 0.0–30.0)
Microalb, Ur: 9.9 mg/dL — ABNORMAL HIGH (ref 0.0–1.9)

## 2019-05-19 LAB — LDL CHOLESTEROL, DIRECT: Direct LDL: 60 mg/dL

## 2019-05-19 LAB — TSH: TSH: 1.79 u[IU]/mL (ref 0.35–4.50)

## 2019-05-19 NOTE — Progress Notes (Signed)
Chief Complaint:  James Bautista is a 39 y.o. male who presents today for his annual comprehensive physical exam.    Assessment/Plan:  New/Acute Problems: Folliculitis Likely secondary to ingrown hair/shaving.  Recommended topical antibiotic ointment and letting care out for 1 to 2 weeks.  Stress Discussed stress management techniques.  Does not want start medication at this point.  Chronic Problems Addressed Today: Type 2 diabetes mellitus (HCC) Check A1c, CBC, C met, TSH.  Continue Metformin at current dose.  Consider increasing to 1000 mg daily depending on result of A1c.  Dyslipidemia associated with type 2 diabetes mellitus (HCC) Stable.  Check lipid panel.  Preventative Healthcare: Discussed Covid vaccine extensively.  Given his prior history of demyelinating disease likely secondary to flu vaccine, patient would like to hold off on any Covid vaccine for the time being.  We will continue to monitor case reports with mRNA vaccine however so far does not look like there have been any reports of acute moderate or other demyelinating processes.  Will check labs as noted above.  Patient Counseling(The following topics were reviewed and/or handout was given):  -Nutrition: Stressed importance of moderation in sodium/caffeine intake, saturated fat and cholesterol, caloric balance, sufficient intake of fresh fruits, vegetables, and fiber.  -Stressed the importance of regular exercise.   -Substance Abuse: Discussed cessation/primary prevention of tobacco, alcohol, or other drug use; driving or other dangerous activities under the influence; availability of treatment for abuse.   -Injury prevention: Discussed safety belts, safety helmets, smoke detector, smoking near bedding or upholstery.   -Sexuality: Discussed sexually transmitted diseases, partner selection, use of condoms, avoidance of unintended pregnancy and contraceptive alternatives.   -Dental health: Discussed importance of regular  tooth brushing, flossing, and dental visits.  -Health maintenance and immunizations reviewed. Please refer to Health maintenance section.  Return to care in 1 year for next preventative visit.     Subjective:  HPI:  He has no acute complaints today.   Over the last several weeks has noticed more anxiety symptoms.  He is having more difficulty falling asleep and procrastinating more than previously.  Also has noticed low motivation .  He has had mildly increased stress at work but not significantly.  He is concerned that sugars may be elevated.  He has also noticed some facial breakout.   Lifestyle Diet: None specific.  Exercise: Limited. Rides exercise bike occasionally.   Depression screen PHQ 2/9 02/09/2018  Decreased Interest 0  Down, Depressed, Hopeless 0  PHQ - 2 Score 0    Health Maintenance Due  Topic Date Due  . URINE MICROALBUMIN  10/09/2018  . FOOT EXAM  02/10/2019     PMH:  The following were reviewed and entered/updated in epic: Past Medical History:  Diagnosis Date  . Allergy    seasonal  . Diabetes mellitus ([place])   . Hyperlipemia   . Kidney stone    never passed a stone or had radiographic evidence of stone.   . Prostatitis 2010   had to wear catheter for urinary retention. [facility] urologist.    Patient Active Problem List   Diagnosis Date Noted  . Slurred speech 12/23/2018  . Dyslipidemia associated with type 2 diabetes mellitus ([place]) 05/17/2018  . 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 Seasonal allergies 10/08/2017  . Type 2 diabetes mellitus ([place]) 12/06/2010   Past Surgical History:  Procedure Laterality Date  . FEMUR FRACTURE SURGERY  '92   right leg.  [name] LASIK    .  WISDOM TOOTH EXTRACTION Bilateral 10/2017    Family History  Problem Relation Age of Onset  . Diabetes Father   . Hyperlipidemia Father   . Goiter Sister   . Cancer Paternal Aunt        ovarian cancer  . Cancer Maternal Grandmother        breast  .  COPD Maternal Grandmother   . Heart disease Paternal Grandmother   . Dementia Paternal Grandmother   . Thyroid disease Paternal Grandmother   . Diabetes Paternal Grandfather   . Heart disease Paternal Grandfather   . Multiple sclerosis Neg Hx     Medications- reviewed and updated Current Outpatient Medications  Medication Sig Dispense Refill  . atorvastatin (LIPITOR) 40 MG tablet TAKE 1 TABLET BY MOUTH EVERY DAY 90 tablet 3  . fexofenadine (ALLEGRA) 180 MG tablet Take 180 mg by mouth daily as needed for allergies or rhinitis.    . fluticasone (FLONASE) 50 MCG/ACT nasal spray Place 2 sprays into both nostrils daily as needed for allergies or rhinitis.     . metFORMIN (GLUCOPHAGE XR) 500 MG 24 hr tablet Take 1 tablet (500 mg total) by mouth daily with breakfast. 90 tablet 3   No current facility-administered medications for this visit.    Allergies-reviewed and updated Allergies  Allergen Reactions  . Neomycin Swelling and Other (See Comments)    Neomycin ear drops - ears swelled shut  . Banana Rash and Other (See Comments)    "Tastes like eating a porcupine"  . Nickel Rash  . Peach [Prunus Persica] Rash and Other (See Comments)    "Tastes like eating a porcupine"    Social History   Socioeconomic History  . Marital status: Married    Spouse name: Randal Goens  . Number of children: 0  . Years of education: 34  . Highest education level: Not on file  Occupational History  . Occupation: Trial Chief Executive Officer  Tobacco Use  . Smoking status: Never Smoker  . Smokeless tobacco: Never Used  Substance and Sexual Activity  . Alcohol use: No    Alcohol/week: 0.0 standard drinks    Comment: 1 drink per month  . Drug use: No  . Sexual activity: Yes    Partners: Female  Other Topics Concern  . Not on file  Social History Narrative   [place] - [place], [employer] 2010. Married. Uses condoms. 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   Social Determinants of Health    Financial Resource Strain:   . Difficulty of Paying Living Expenses: Not on file  Food Insecurity:   . Worried About Charity fundraiser in the Last Year: Not on file  . Ran Out of Food in the Last Year: Not on file  Transportation Needs:   . Lack of Transportation (Medical): Not on file  . Lack of Transportation (Non-Medical): Not on file  Physical Activity:   . Days of Exercise per Week: Not on file  . Minutes of Exercise per Session: Not on file  Stress:   . Feeling of Stress : Not on file  Social Connections:   . Frequency of Communication with Friends and Family: Not on file  . Frequency of Social Gatherings with Friends and Family: Not on file  . Attends Religious Services: Not on file  . Active Member of Clubs or Organizations: Not on file  . Attends Archivist Meetings: Not on file  . Marital Status: Not on file  Objective:  Physical Exam: BP (!) 144/88   Pulse 75   Temp 98 F (36.7 C)   Ht 6' (1.829 m)   Wt 231 lb 8 oz (105 kg)   SpO2 98%   BMI 31.40 kg/m   Body mass index is 31.4 kg/m. Wt Readings from Last 3 Encounters:  05/19/19 231 lb 8 oz (105 kg)  12/23/18 216 lb 8 oz (98.2 kg)  12/22/18 213 lb 4 oz (96.7 kg)   Gen: NAD, resting comfortably HEENT: TMs normal bilaterally. OP clear. No thyromegaly noted.  CV: RRR with no murmurs appreciated Pulm: NWOB, CTAB with no crackles, wheezes, or rhonchi GI: Normal bowel sounds present. Soft, Nontender, Nondistended. MSK: no edema, cyanosis, or clubbing noted Skin: warm, dry.  Several inflamed hair follicles on right chin. Neuro: CN2-12 grossly intact. Strength 5/5 in upper and lower extremities. Reflexes symmetric and intact bilaterally.  Psych: Normal affect and thought content     Ludie Pavlik M. Jerline Pain, MD 05/19/2019 9:35 AM

## 2019-05-19 NOTE — Assessment & Plan Note (Signed)
Stable. Check lipid panel. 

## 2019-05-19 NOTE — Patient Instructions (Signed)
It was very nice to see you today!  Please try using antibiotic ointment on your face.  Try growing your hair out for a couple of weeks.  Let me know if not improving.  We will check blood work today.  No other changes today.  We will need to see back in 3 to 6 months depending on your blood work.   Take care, Dr Jerline Pain  Please try these tips to maintain a healthy lifestyle:   Eat at least 3 REAL meals and 1-2 snacks per day.  Aim for no more than 5 hours between eating.  If you eat breakfast, please do so within one hour of getting up.    Each meal should contain half fruits/vegetables, one quarter protein, and one quarter carbs (no bigger than a computer mouse)   Cut down on sweet beverages. This includes juice, soda, and sweet tea.     Drink at least 1 glass of water with each meal and aim for at least 8 glasses per day   Exercise at least 150 minutes every week.    Preventive Care 51-97 Years Old, Male Preventive care refers to lifestyle choices and visits with your health care provider that can promote health and wellness. This includes:  A yearly physical exam. This is also called an annual well check.  Regular dental and eye exams.  Immunizations.  Screening for certain conditions.  Healthy lifestyle choices, such as eating a healthy diet, getting regular exercise, not using drugs or products that contain nicotine and tobacco, and limiting alcohol use. What can I expect for my preventive care visit? Physical exam Your health care provider will check:  Height and weight. These may be used to calculate body mass index (BMI), which is a measurement that tells if you are at a healthy weight.  Heart rate and blood pressure.  Your skin for abnormal spots. Counseling Your health care provider may ask you questions about:  Alcohol, tobacco, and drug use.  Emotional well-being.  Home and relationship well-being.  Sexual activity.  Eating habits.  Work  and work Statistician. What immunizations do I need?  Influenza (flu) vaccine  This is recommended every year. Tetanus, diphtheria, and pertussis (Tdap) vaccine  You may need a Td booster every 10 years. Varicella (chickenpox) vaccine  You may need this vaccine if you have not already been vaccinated. Human papillomavirus (HPV) vaccine  If recommended by your health care provider, you may need three doses over 6 months. Measles, mumps, and rubella (MMR) vaccine  You may need at least one dose of MMR. You may also need a second dose. Meningococcal conjugate (MenACWY) vaccine  One dose is recommended if you are 51-78 years old and a Market researcher living in a residence hall, or if you have one of several medical conditions. You may also need additional booster doses. Pneumococcal conjugate (PCV13) vaccine  You may need this if you have certain conditions and were not previously vaccinated. Pneumococcal polysaccharide (PPSV23) vaccine  You may need one or two doses if you smoke cigarettes or if you have certain conditions. Hepatitis A vaccine  You may need this if you have certain conditions or if you travel or work in places where you may be exposed to hepatitis A. Hepatitis B vaccine  You may need this if you have certain conditions or if you travel or work in places where you may be exposed to hepatitis B. Haemophilus influenzae type b (Hib) vaccine  You may need  this if you have certain risk factors. You may receive vaccines as individual doses or as more than one vaccine together in one shot (combination vaccines). Talk with your health care provider about the risks and benefits of combination vaccines. What tests do I need? Blood tests  Lipid and cholesterol levels. These may be checked every 5 years starting at age 88.  Hepatitis C test.  Hepatitis B test. Screening   Diabetes screening. This is done by checking your blood sugar (glucose) after you have  not eaten for a while (fasting).  Sexually transmitted disease (STD) testing. Talk with your health care provider about your test results, treatment options, and if necessary, the need for more tests. Follow these instructions at home: Eating and drinking   Eat a diet that includes fresh fruits and vegetables, whole grains, lean protein, and low-fat dairy products.  Take vitamin and mineral supplements as recommended by your health care provider.  Do not drink alcohol if your health care provider tells you not to drink.  If you drink alcohol: ? Limit how much you have to 0-2 drinks a day. ? Be aware of how much alcohol is in your drink. In the [place] one drink equals one 12 oz bottle of beer (355 mL), one 5 oz glass of wine (148 mL), or one 1 oz glass of hard liquor (44 mL). Lifestyle  Take daily care of your teeth and gums.  Stay active. Exercise for at least 30 minutes on 5 or more days each week.  Do not use any products that contain nicotine or tobacco, such as cigarettes, e-cigarettes, and chewing tobacco. If you need help quitting, ask your health care provider.  If you are sexually active, practice safe sex. Use a condom or other form of protection to prevent STIs (sexually transmitted infections). What's next?  Go to your health care provider once a year for a well check visit.  Ask your health care provider how often you should have your eyes and teeth checked.  Stay up to date on all vaccines. This information is not intended to replace advice given to you by your health care provider. Make sure you discuss any questions you have with your health care provider. Document Revised: 04/22/2018 Document Reviewed: 04/22/2018 Elsevier Patient Education  2020 [employer].

## 2019-05-19 NOTE — Assessment & Plan Note (Signed)
Check A1c, CBC, C met, TSH.  Continue Metformin at current dose.  Consider increasing to 1000 mg daily depending on result of A1c.

## 2019-05-20 ENCOUNTER — Other Ambulatory Visit: Payer: Self-pay

## 2019-05-20 MED ORDER — METFORMIN HCL 1000 MG PO TABS: 1000.0000 mg | ORAL_TABLET | Freq: Every day | ORAL | 2 refills | Status: DC

## 2019-05-20 NOTE — Progress Notes (Signed)
Please inform patient of the following:  A1c is up a bit but all of his other labs are stable. Recommend increasing metformin to 1000mg  daily - please send in new rx if needed. Would like for him to come back in 6 months to recheck.

## 2019-06-09 ENCOUNTER — Ambulatory Visit: Payer: 59 | Admitting: Neurology

## 2019-06-09 ENCOUNTER — Other Ambulatory Visit: Payer: Self-pay

## 2019-06-09 ENCOUNTER — Encounter: Payer: Self-pay | Admitting: Neurology

## 2019-06-09 VITALS — BP 143/89 | HR 74 | Temp 97.2°F | Ht 72.0 in | Wt 232.5 lb

## 2019-06-09 DIAGNOSIS — G379 Demyelinating disease of central nervous system, unspecified: Secondary | ICD-10-CM | POA: Diagnosis not present

## 2019-06-09 DIAGNOSIS — R4781 Slurred speech: Secondary | ICD-10-CM

## 2019-06-09 NOTE — Progress Notes (Signed)
[facility]  PATIENT: James Bautista DOB: 05-10-1981  REFERRING DOCTOR OR PCP:  Dimas Chyle, MD SOURCE: Patient, notes from hospital admission, imaging and laboratory reports, MRI images personally reviewed.  _________________________________   HISTORICAL  CHIEF COMPLAINT:  Chief Complaint  Patient presents with  . Follow-up    RM 13, alone. Last seen 12/23/2018. Doing well, no new sx.    HISTORY OF PRESENT ILLNESS:  James Bautista is a 39 y.o. man who presented with slurred speech and abnormal brain MRI 03/2018.   Update 06/09/2019: He reports that he is doing well and has no new symptoms.   He feels his speech and thought processes have improved .  He still feels formation of complex thoughts and turning them into phrases is still slowed.    Gait, strength, sensation, coordination and bladder function are fine.     The brain MRI 12/01/2018 showed the single focus concerning for large demyelinating plaque.  It was slightly smaller than previous MRI and there was no enhancement.   Of note, his initial symptoms  Initial symptoms were a few weeks after he received a flu shot (02/09/2018).  We had a long discussion about the Covid vaccination.  There do not appear to be demyelinating events occurring at any significant level after the [place] or Moderna vaccinations.  Because he works with the general public at close range, the risks of the vaccination are likely lower than the risk of catching Covid.  Update 12/23/2018: James Bautista continues to do well.  He is practically back to baseline.  There is no more slurred speech and no more hand coordination issues.  He occasionally notes very mild left lip weakness.  We repeated the MRI of the brain 12/01/2018.  It looks very good.  The dominant lesion in the posterior right frontal lobe is either the same or slightly smaller than the previous MRI and clearly better than the November 2019 MRI.  I had a long discussion with him (and his  wife on face time).  We went over the differential diagnosis: Post vaccination monophasic demyelinating event, early tumefactive MS, neoplastic process.  The fact that the enhancement has resolved and the lesion is smaller, makes a neoplastic process much less likely.  As time goes by, if there are no new lesions, MS becomes less likely and a postvaccination event becomes more likely.  He denies any problems with his gait though he did have a recent fall injuring himself mildly.  Strength, sensation, bladder function and vision are fine.  Cognition is fine.  Update 06/15/2018: He is now feeling back to baseline.  Specifically, coordination is normal in the left hand.  The numbness has resolved.  Speech is normal.  I reviewed his recent brain MRI performed January 2020 in the presence of him and his wife.  It shows that the large right frontal lobe focus no longer enhances and there is reduced edema.   We had a long discussion about the significance of the studies to date.  He had a vaccination about 2 weeks before symptoms started (vaccination)   discussed that he might possibly have a post vaccination monophasic CNS event.  However, oligoclonal bands were present and he could also have a tumefactive lesion associated with MS.  He has DM controlled on metformin.      From 04/21/2018:  He is a 40 year old man who was at his baseline until mid October 2019.  At that time, he noted that he started biting the  inside of his mouth.   He had a dental appointment which was non-contributary.   He continued to have some inner cheek biting the next couple weeks.   On 03/14/2018, he had the onset of left facial dysesthesias.   He noticed a sinus stuffy sensation.    While speaking, he noticed his voice was slurred.   He works as a Chief Executive Officer.  The next day, while looking in a mirror, he noted his smile was asymmetric, weaker on the left.   Additionally, speech was more slurred.  He presented to the [facility] ED 03/15/2018.   CT  scan showed a region of edema in the posterior right frontal lobe.[name]   His brain MRI showed a large right subcortical focus with patchy enhancement and restriction on DWI.     He was admitted and had 5 days of IV Solu-Medrol.  Every day, he noted some improvement.   By discharge, slurred speech was much better but there was still some facial > hand numbness.    He did have some problems with left hand coordination for a few weeks, now nearly completely resolved.   His smile is now nearly symmetric    He has never had any other neurologic symptom lasting more than a few minutes.  He has never noted any significant visual symptom.     He has essential hypertension and diabetes mellitus.  He had the flu-shot 02/09/2018.   The vaccination was Fluarix quadrivalent Lot [id number]  Exp 11/09/2018   [place] [id number].    No antecedent infections.  I personally reviewed the MRI of the brain 03/15/2018 and MRI of the cervical thoracic spine 03/16/2018.  The MRI of the brain shows a juxtacortical/subcortical large T2/FLAIR hyperintense focus that also showed on diffusion-weighted images.  There was some peripheral enhancement incompletely.  There were no other lesions in the brain . MRI of the spine did not show any additional lesions.  There is no spinal cord compression though he has mild spinal stenosis at C4-C5.  REVIEW OF SYSTEMS: Constitutional: No fevers, chills, sweats, or change in appetite Eyes: No visual changes, double vision, eye pain Ear, nose and throat: No hearing loss, ear pain, nasal congestion, sore throat Cardiovascular: No chest pain, palpitations Respiratory: No shortness of breath at rest or with exertion.   No wheezes GastrointestinaI: No nausea, vomiting, diarrhea, abdominal pain, fecal incontinence Genitourinary: No dysuria, urinary retention or frequency.  No nocturia. Musculoskeletal: No neck pain, back pain Integumentary: No rash, pruritus, skin lesions Neurological: as above  Psychiatric: No depression at this time.  No anxiety Endocrine: He has diabetes and is on metformin and insulin.  Hematologic/Lymphatic: No anemia, purpura, petechiae. Allergic/Immunologic: No itchy/runny eyes, nasal congestion, recent allergic reactions, rashes  ALLERGIES: Allergies  Allergen Reactions  . Neomycin Swelling and Other (See Comments)    Neomycin ear drops - ears swelled shut  . Banana Rash and Other (See Comments)    "Tastes like eating a porcupine"  . Nickel Rash  . Peach [Prunus Persica] Rash and Other (See Comments)    "Tastes like eating a porcupine"    HOME MEDICATIONS:  Current Outpatient Medications:  .  atorvastatin (LIPITOR) 40 MG tablet, TAKE 1 TABLET BY MOUTH EVERY DAY, Disp: 90 tablet, Rfl: 3 .  fexofenadine (ALLEGRA) 180 MG tablet, Take 180 mg by mouth daily as needed for allergies or rhinitis., Disp: , Rfl:  .  fluticasone (FLONASE) 50 MCG/ACT nasal spray, Place 2 sprays into both nostrils daily as needed for  allergies or rhinitis. , Disp: , Rfl:  .  metFORMIN (GLUCOPHAGE XR) 500 MG 24 hr tablet, Take 1 tablet (500 mg total) by mouth daily with breakfast. (Patient taking differently: Take 750 mg by mouth daily with breakfast. ), Disp: 90 tablet, Rfl: 3 .  metFORMIN (GLUCOPHAGE) 1000 MG tablet, Take 1 tablet (1,000 mg total) by mouth daily. (Patient not taking: Reported on 06/09/2019), Disp: 90 tablet, Rfl: 2  PAST MEDICAL HISTORY: Past Medical History:  Diagnosis Date  . Allergy    seasonal  . Diabetes mellitus ([place])   . Hyperlipemia   . Kidney stone    never passed a stone or had radiographic evidence of stone.   . Prostatitis 2010   had to wear catheter for urinary retention. [facility] urologist.     PAST SURGICAL HISTORY: Past Surgical History:  Procedure Laterality Date  . FEMUR FRACTURE SURGERY  '92   right leg.  [name] LASIK    . WISDOM TOOTH EXTRACTION Bilateral 10/2017    FAMILY HISTORY: Family History  Problem Relation Age of Onset  .  Diabetes Father   . Hyperlipidemia Father   . Goiter Sister   . Cancer Paternal Aunt        ovarian cancer  . Cancer Maternal Grandmother        breast  . COPD Maternal Grandmother   . Heart disease Paternal Grandmother   . Dementia Paternal Grandmother   . Thyroid disease Paternal Grandmother   . Diabetes Paternal Grandfather   . Heart disease Paternal Grandfather   . Multiple sclerosis Neg Hx     SOCIAL HISTORY:  Social History   Socioeconomic History  . Marital status: Married    Spouse name: Abukar Harp  . Number of children: 0  . Years of education: 23  . Highest education level: Not on file  Occupational History  . Occupation: Trial Chief Executive Officer  Tobacco Use  . Smoking status: Never Smoker  . Smokeless tobacco: Never Used  Substance and Sexual Activity  . Alcohol use: No    Alcohol/week: 0.0 standard drinks    Comment: 1 drink per month  . Drug use: No  . Sexual activity: Yes    Partners: Female  Other Topics Concern  . Not on file  Social History Narrative   [place] - [place], [employer] 2010. Married. Uses condoms. 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   Social Determinants of Health   Financial Resource Strain:   . Difficulty of Paying Living Expenses: Not on file  Food Insecurity:   . Worried About Charity fundraiser in the Last Year: Not on file  . Ran Out of Food in the Last Year: Not on file  Transportation Needs:   . Lack of Transportation (Medical): Not on file  . Lack of Transportation (Non-Medical): Not on file  Physical Activity:   . Days of Exercise per Week: Not on file  . Minutes of Exercise per Session: Not on file  Stress:   . Feeling of Stress : Not on file  Social Connections:   . Frequency of Communication with Friends and Family: Not on file  . Frequency of Social Gatherings with Friends and Family: Not on file  . Attends Religious Services: Not on file  . Active Member of Clubs or Organizations: Not on file   . Attends Archivist Meetings: Not on file  . Marital Status: Not on file  Intimate Partner Violence:   .  Fear of Current or Ex-Partner: Not on file  . Emotionally Abused: Not on file  . Physically Abused: Not on file  . Sexually Abused: Not on file     PHYSICAL EXAM  Vitals:   06/09/19 0906  BP: (!) 143/89  Pulse: 74  Temp: (!) 97.2 F (36.2 C)  Weight: 232 lb 8 oz (105.5 kg)  Height: 6' (1.829 m)    Body mass index is 31.53 kg/m.   General: The patient is well-developed and well-nourished and in no acute distress  Neurologic Exam  Mental status: The patient is alert and oriented x 3 at the time of the examination. The patient has apparent normal recent and remote memory, with an apparently normal attention span and concentration ability.   Speech is normal.  Cranial nerves: Extraocular movements are full.  Facial strength and sensation is normal.  Trapezius strength is normal.  No dysarthria.   No obvious hearing deficits are noted.  Motor:  Muscle bulk is normal.   Tone is normal. Strength is  5 / 5 in all 4 extremities.   Sensory: Intact sensation to touch and vibration.  Coordination: Cerebellar testing shows good finger-nose-finger and heel-to-shin  Gait and station: Station is normal.   Gait and station are normal.  Romberg is negative.  Reflexes: Deep tendon reflexes are symmetric and normal bilaterally.        ASSESSMENT AND PLAN    1. 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 Slurred speech      1.    The MRIs have been stable with no new lesions.  The 2 most likely causes of this would be either a postvaccination syndrome or early multiple sclerosis with a tumefactive lesion.  We will recheck another MRI in the late fall 2021 but he is advised to call sooner if he has any new symptom.  We have discussed that if this does develop further to be consistent with MS that we would start a disease modifying therapy.  2.    He  will return to see me in 6 months or so, a week or 2 after the next MRI.  He should call sooner if there are new or worsening neurologic symptoms.  Hadiyah Maricle A. Felecia Shelling, MD, [facility] [id number], 0000000 AM Certified in Neurology, Clinical Neurophysiology, Sleep Medicine, Pain Medicine and Neuroimaging  [facility] [address], [place], [place] [address] [phone]

## 2019-07-01 ENCOUNTER — Encounter: Payer: Self-pay | Admitting: Family Medicine

## 2019-07-01 NOTE — Telephone Encounter (Signed)
Please advise 

## 2019-07-15 ENCOUNTER — Telehealth: Payer: Self-pay | Admitting: Family Medicine

## 2019-07-15 ENCOUNTER — Encounter: Payer: Self-pay | Admitting: Family Medicine

## 2019-07-15 NOTE — Telephone Encounter (Signed)
Pt received COVID Pfeizer vaccine on 07/13/2019.

## 2019-07-15 NOTE — Telephone Encounter (Signed)
Chart updated

## 2019-08-13 ENCOUNTER — Other Ambulatory Visit: Payer: Self-pay | Admitting: Family Medicine

## 2019-08-15 ENCOUNTER — Other Ambulatory Visit: Payer: Self-pay | Admitting: *Deleted

## 2019-10-28 ENCOUNTER — Telehealth: Payer: Self-pay | Admitting: Family Medicine

## 2019-10-28 NOTE — Telephone Encounter (Signed)
Patient is calling stating he received a bill from his physical, asking if someone can take a look at it.

## 2019-12-06 NOTE — Telephone Encounter (Signed)
Sent message to coding to review.

## 2019-12-23 NOTE — Telephone Encounter (Signed)
No response.  Forwarded email to charge correction for assistance.

## 2019-12-28 ENCOUNTER — Telehealth: Payer: Self-pay | Admitting: Neurology

## 2019-12-28 NOTE — Telephone Encounter (Signed)
[employer] Auth: [id number] (exp. 12/28/19 to 02/11/20) patient is scheduled at [facility] for 01/18/20.

## 2020-01-18 ENCOUNTER — Other Ambulatory Visit: Payer: Self-pay

## 2020-01-18 ENCOUNTER — Ambulatory Visit: Payer: 59

## 2020-01-18 DIAGNOSIS — R4781 Slurred speech: Secondary | ICD-10-CM

## 2020-01-18 DIAGNOSIS — G379 Demyelinating disease of central nervous system, unspecified: Secondary | ICD-10-CM | POA: Diagnosis not present

## 2020-01-18 MED ORDER — GADOBENATE DIMEGLUMINE 529 MG/ML IV SOLN
20.0000 mL | Freq: Once | INTRAVENOUS | Status: AC | PRN
  Administered 2020-01-18: 20 mL via INTRAVENOUS

## 2020-02-09 ENCOUNTER — Ambulatory Visit: Payer: 59 | Admitting: Neurology

## 2020-06-04 ENCOUNTER — Encounter: Payer: Self-pay | Admitting: Family Medicine

## 2020-06-05 NOTE — Telephone Encounter (Signed)
Please advise 

## 2020-07-06 DIAGNOSIS — Z3141 Encounter for fertility testing: Secondary | ICD-10-CM | POA: Diagnosis not present

## 2020-07-10 IMAGING — RF DG FLUORO GUIDE LUMBAR PUNCTURE
1 series · 1 of 1 positions shown · non-contrast
Comparison: none

CLINICAL DATA: 36-year-old male with right frontal brain lesion
suspected due to demyelination. Unsuccessful bedside lumbar puncture
for CSF analysis.

[Series 1: cp_standard · 0.09mm/px · 1 of 1 slices shown]
[im 1/1]
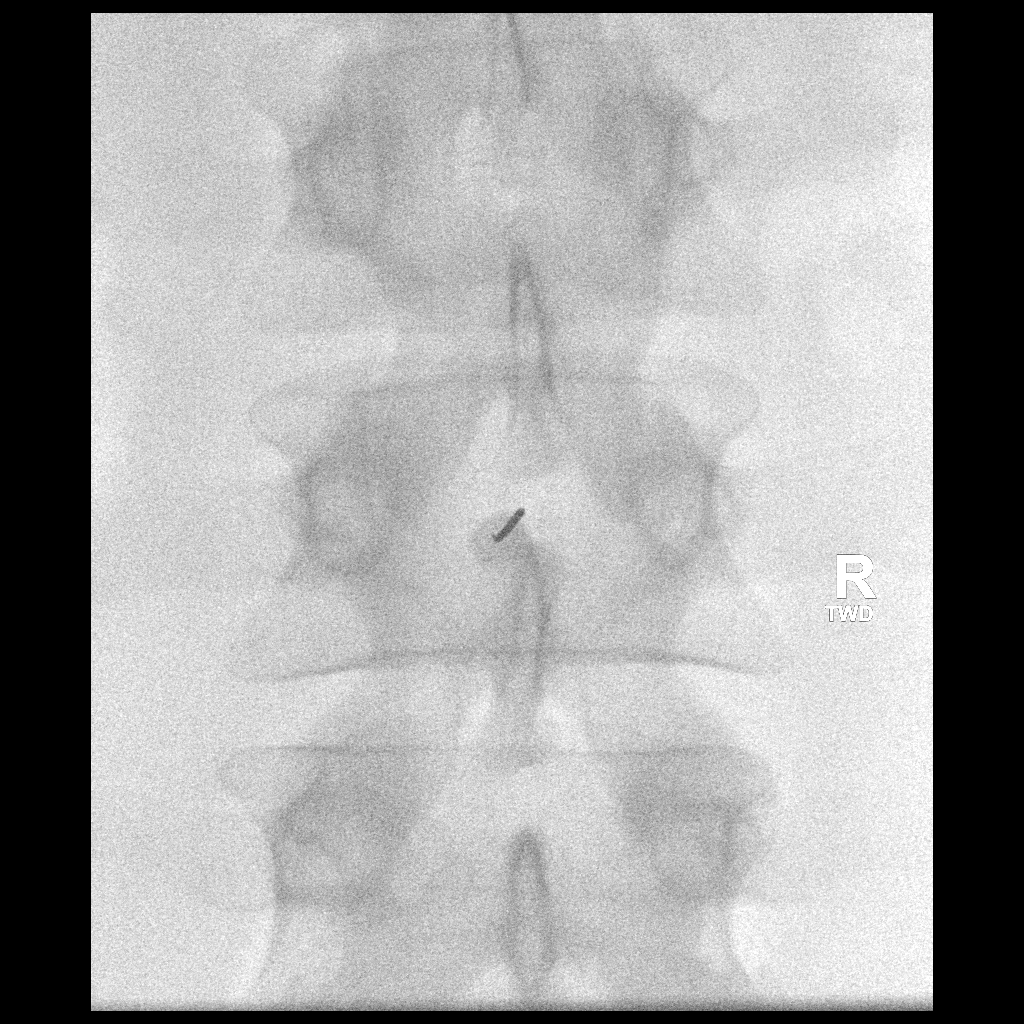

[1 of 1 positions shown; findings below may reference images not displayed]

EXAM:
DIAGNOSTIC LUMBAR PUNCTURE UNDER FLUOROSCOPIC GUIDANCE

FLUOROSCOPY TIME:  Fluoroscopy Time:  0 minutes 12 seconds

Radiation Exposure Index (if provided by the fluoroscopic device):

Number of Acquired Spot Images: 0

PROCEDURE:
Informed consent was obtained from the patient prior to the
procedure, including potential complications of headache, allergy,
and pain. A "time-out" was performed.

With the patient prone, the lower back was prepped with Betadine. 1%
Lidocaine was used for local anesthesia. Lumbar puncture was
performed at the L2-L3 level using a 3.5 in x 20 gauge needle with
return of clear, colorless CSF. An opening pressure with the patient
prone of 21.5 cm water was obtained. 19 ml of CSF were obtained for
laboratory studies. The needle was withdrawn, direct pressure held
and hemostasis noted.

The patient tolerated the procedure well and there were no apparent
complications. Appropriate post procedural orders were placed on the
chart. The patient was returned to the inpatient floor in stable
condition for continued treatment.
IMPRESSION: Fluoroscopic guided lumbar puncture. 19 mL of clear CSF were
obtained and sent to the lab for analysis.

## 2020-07-30 ENCOUNTER — Encounter: Payer: Self-pay | Admitting: Family Medicine

## 2020-08-07 ENCOUNTER — Other Ambulatory Visit: Payer: Self-pay

## 2020-08-07 MED ORDER — METFORMIN HCL 1000 MG PO TABS: 1000.0000 mg | ORAL_TABLET | Freq: Every day | ORAL | 0 refills | Status: DC

## 2020-08-31 ENCOUNTER — Encounter: Payer: Self-pay | Admitting: Family Medicine

## 2020-08-31 ENCOUNTER — Other Ambulatory Visit: Payer: Self-pay

## 2020-08-31 ENCOUNTER — Ambulatory Visit: Payer: BC Managed Care – PPO | Admitting: Family Medicine

## 2020-08-31 VITALS — BP 164/118 | HR 75 | Temp 98.0°F | Ht 72.0 in | Wt 225.4 lb

## 2020-08-31 DIAGNOSIS — E119 Type 2 diabetes mellitus without complications: Secondary | ICD-10-CM

## 2020-08-31 DIAGNOSIS — R030 Elevated blood-pressure reading, without diagnosis of hypertension: Secondary | ICD-10-CM

## 2020-08-31 DIAGNOSIS — E1169 Type 2 diabetes mellitus with other specified complication: Secondary | ICD-10-CM

## 2020-08-31 DIAGNOSIS — E785 Hyperlipidemia, unspecified: Secondary | ICD-10-CM

## 2020-08-31 LAB — COMPREHENSIVE METABOLIC PANEL
ALT: 78 U/L — ABNORMAL HIGH (ref 0–53)
AST: 33 U/L (ref 0–37)
Albumin: 4.5 g/dL (ref 3.5–5.2)
Alkaline Phosphatase: 93 U/L (ref 39–117)
BUN: 15 mg/dL (ref 6–23)
CO2: 29 mEq/L (ref 19–32)
Calcium: 9.7 mg/dL (ref 8.4–10.5)
Chloride: 97 mEq/L (ref 96–112)
Creatinine, Ser: 1.07 mg/dL (ref 0.40–1.50)
GFR: 88.7 mL/min (ref >60.00)
Glucose, Bld: 166 mg/dL — ABNORMAL HIGH (ref 70–99)
Potassium: 4.3 mEq/L (ref 3.5–5.1)
Sodium: 137 mEq/L (ref 135–145)
Total Bilirubin: 0.8 mg/dL (ref 0.2–1.2)
Total Protein: 7.4 g/dL (ref 6.0–8.3)

## 2020-08-31 LAB — CBC
HCT: 49 % (ref 39.0–52.0)
Hemoglobin: 16.7 g/dL (ref 13.0–17.0)
MCHC: 34.1 g/dL (ref 30.0–36.0)
MCV: 75.8 fl — ABNORMAL LOW (ref 78.0–100.0)
Platelets: 186 10*3/uL (ref 150.0–400.0)
RBC: 6.47 Mil/uL — ABNORMAL HIGH (ref 4.22–5.81)
RDW: 16.1 % — ABNORMAL HIGH (ref 11.5–15.5)
WBC: 9.4 10*3/uL (ref 4.0–10.5)

## 2020-08-31 LAB — LIPID PANEL
Cholesterol: 148 mg/dL (ref 0–200)
HDL: 35.4 mg/dL — ABNORMAL LOW (ref >39.00)
NonHDL: 113.02
Total CHOL/HDL Ratio: 4
Triglycerides: 252 mg/dL — ABNORMAL HIGH (ref 0.0–149.0)
VLDL: 50.4 mg/dL — ABNORMAL HIGH (ref 0.0–40.0)

## 2020-08-31 LAB — POCT GLYCOSYLATED HEMOGLOBIN (HGB A1C): Hemoglobin A1C: 8 % — AB (ref 4.0–5.6)

## 2020-08-31 LAB — LDL CHOLESTEROL, DIRECT: Direct LDL: 93 mg/dL

## 2020-08-31 LAB — TSH: TSH: 2.05 u[IU]/mL (ref 0.35–4.50)

## 2020-08-31 MED ORDER — ATORVASTATIN CALCIUM 40 MG PO TABS
1.0000 | ORAL_TABLET | Freq: Every day | ORAL | 3 refills | Status: DC
Start: 2020-08-31 — End: 2021-08-26

## 2020-08-31 MED ORDER — METFORMIN HCL 1000 MG PO TABS: 1000.0000 mg | ORAL_TABLET | Freq: Every day | ORAL | 3 refills | Status: DC

## 2020-08-31 NOTE — Patient Instructions (Signed)
It was very nice to see you today!  I will refill your medications today.  Please keep an eye on your blood pressure and let me know if persistently 140/90 or higher.  We will check blood work today.  I will see back in 3 to 6 months to recheck your A1c.  Come back to see me sooner if needed.  Take care, Dr Jerline Pain  PLEASE NOTE:  If you had any lab tests please let us know if you have not heard back within a few days. You may see your results on mychart before we have a chance to review them but we will give you a call once they are reviewed by [place]. If we ordered any referrals today, please let us know if you have not heard from their office within the next week.   Please try these tips to maintain a healthy lifestyle:   Eat at least 3 REAL meals and 1-2 snacks per day.  Aim for no more than 5 hours between eating.  If you eat breakfast, please do so within one hour of getting up.    Each meal should contain half fruits/vegetables, one quarter protein, and one quarter carbs (no bigger than a computer mouse)   Cut down on sweet beverages. This includes juice, soda, and sweet tea.     Drink at least 1 glass of water with each meal and aim for at least 8 glasses per day   Exercise at least 150 minutes every week.

## 2020-08-31 NOTE — Assessment & Plan Note (Signed)
Refilled Lipitor.  Check lipids today.

## 2020-08-31 NOTE — Assessment & Plan Note (Signed)
A1c 8.0.  Has had some dietary indiscretions recently and has been under more stress.  We will continue metformin 1000 mg daily.  He will work on diet and exercise.  Recheck A1c 3 to 6 months.

## 2020-08-31 NOTE — Progress Notes (Signed)
   James Bautista is a 40 y.o. male who presents today for an office visit.  Assessment/Plan:  New/Acute Problems: Elevated BP Reading Above goal.  Typically well controlled.  Has some whitecoat hypertension.  He will continue home monitoring goal 140/90 or lower.  He will check with me in a few weeks via MyChart if persistently elevated.  RUQ Abdominal Pain Now resolved.  Possibly could have been gallstone kidney stone also possible due to his history.  He will let me know if symptoms return.  Do not need to do any further work-up at this point now that symptoms have resolved and not returned  Chronic Problems Addressed Today: Type 2 diabetes mellitus (HCC) A1c 8.0.  Has had some dietary indiscretions recently and has been under more stress.  We will continue metformin 1000 mg daily.  He will work on diet and exercise.  Recheck A1c 3 to 6 months.  Dyslipidemia associated with type 2 diabetes mellitus (HCC) Refilled Lipitor.  Check lipids today.     Subjective:  HPI:  See A/P for status of chronic conditions.  Patient is doing well today.  He did have an episode about 6 months ago of right flank pain.  He thought it was due to a gallstone.  He tried some home remedies and symptoms improved.  His not had any issues since then.  He had COVID a few months ago.  Was ill for about 5 days but recovered.  No residual effects.       Objective:  Physical Exam: BP (!) 164/118   Pulse 75   Temp 98 F (36.7 C) (Temporal)   Ht 6' (1.829 m)   Wt 225 lb 6.4 oz (102.2 kg)   SpO2 98%   BMI 30.57 kg/m   Wt Readings from Last 3 Encounters:  08/31/20 225 lb 6.4 oz (102.2 kg)  06/09/19 232 lb 8 oz (105.5 kg)  05/19/19 231 lb 8 oz (105 kg)    Gen: No acute distress, resting comfortably CV: Regular rate and rhythm with no murmurs appreciated Pulm: Normal work of breathing, clear to auscultation bilaterally with no crackles, wheezes, or rhonchi Neuro: Grossly normal, moves all  extremities Psych: Normal affect and thought content      Nadja Lina M. Jerline Pain, MD 08/31/2020 9:26 AM

## 2020-09-03 NOTE — Progress Notes (Signed)
Please inform patient of the following:  His cholesterol went up a bit since last time but overall everything is stable. Do not need to make any changes to his treatment plan. Would like for him to continue to work on diet and exercise and we can recheck in a year.  James Bautista. Jerline Pain, MD 09/03/2020 3:01 PM

## 2020-12-12 ENCOUNTER — Encounter: Payer: Self-pay | Admitting: Family Medicine

## 2020-12-12 ENCOUNTER — Other Ambulatory Visit: Payer: Self-pay

## 2020-12-12 ENCOUNTER — Ambulatory Visit: Payer: BC Managed Care – PPO | Admitting: Family Medicine

## 2020-12-12 VITALS — BP 164/104 | HR 77 | Temp 98.0°F | Ht 72.0 in | Wt 225.0 lb

## 2020-12-12 DIAGNOSIS — E119 Type 2 diabetes mellitus without complications: Secondary | ICD-10-CM

## 2020-12-12 DIAGNOSIS — I152 Hypertension secondary to endocrine disorders: Secondary | ICD-10-CM | POA: Diagnosis not present

## 2020-12-12 DIAGNOSIS — E1159 Type 2 diabetes mellitus with other circulatory complications: Secondary | ICD-10-CM | POA: Diagnosis not present

## 2020-12-12 LAB — POCT GLYCOSYLATED HEMOGLOBIN (HGB A1C): Hemoglobin A1C: 7.8 % — AB (ref 4.0–5.6)

## 2020-12-12 MED ORDER — LOSARTAN POTASSIUM 50 MG PO TABS: 50.0000 mg | ORAL_TABLET | Freq: Every day | ORAL | 3 refills | Status: DC

## 2020-12-12 NOTE — Assessment & Plan Note (Signed)
Home readings have been in the 140s.  Has been under a lot of stress recently which is likely contributing.  We will start losartan 50 mg daily.  He will monitor at home over the next few weeks.  We can recheck again when he comes back in 3 months.  Recent BMET within normal limits.

## 2020-12-12 NOTE — Patient Instructions (Signed)
It was very nice to see you today!  Please start the losartan.  Keep an eye on your blood pressure.  Let me know how it is looking over the next several weeks.  I will see back in 3 months for your next visit.  Take care, Dr Jerline Pain  PLEASE NOTE:  If you had any lab tests please let us know if you have not heard back within a few days. You may see your results on mychart before we have a chance to review them but we will give you a call once they are reviewed by [place]. If we ordered any referrals today, please let us know if you have not heard from their office within the next week.   Please try these tips to maintain a healthy lifestyle:  Eat at least 3 REAL meals and 1-2 snacks per day.  Aim for no more than 5 hours between eating.  If you eat breakfast, please do so within one hour of getting up.   Each meal should contain half fruits/vegetables, one quarter protein, and one quarter carbs (no bigger than a computer mouse)  Cut down on sweet beverages. This includes juice, soda, and sweet tea.   Drink at least 1 glass of water with each meal and aim for at least 8 glasses per day  Exercise at least 150 minutes every week.

## 2020-12-12 NOTE — Progress Notes (Signed)
   James Bautista is a 40 y.o. male who presents today for an office visit.  Assessment/Plan:  New/Acute Problems: Stress Has been under a lot of stress due to work.  Anticipates this will last for the next several months.  We discussed possible treatment options however he deferred for today.  He will let me know if he would like to explore either of these options later on.  Chronic Problems Addressed Today: Hypertension associated with diabetes ([place]) Home readings have been in the 140s.  Has been under a lot of stress recently which is likely contributing.  We will start losartan 50 mg daily.  He will monitor at home over the next few weeks.  We can recheck again when he comes back in 3 months.  Recent BMET within normal limits.  Type 2 diabetes mellitus (HCC) A1c improved slightly to 7.8.  He has been under a lot of stress recently with work.  Does not have much time to exercise.  We will continue current dose metformin 1000 mg daily.  Recheck in 3 months.     Subjective:  HPI:  See A/p.        Objective:  Physical Exam: BP (!) 164/104   Pulse 77   Temp 98 F (36.7 C) (Temporal)   Ht 6' (1.829 m)   Wt 225 lb (102.1 kg)   SpO2 98%   BMI 30.52 kg/m   Gen: No acute distress, resting comfortably CV: Regular rate and rhythm with no murmurs appreciated Pulm: Normal work of breathing, clear to auscultation bilaterally with no crackles, wheezes, or rhonchi Neuro: Grossly normal, moves all extremities Psych: Normal affect and thought content      Artem Bunte M. Jerline Pain, MD 12/12/2020 9:16 AM

## 2020-12-12 NOTE — Assessment & Plan Note (Signed)
A1c improved slightly to 7.8.  He has been under a lot of stress recently with work.  Does not have much time to exercise.  We will continue current dose metformin 1000 mg daily.  Recheck in 3 months.

## 2021-01-21 ENCOUNTER — Ambulatory Visit: Payer: Self-pay | Admitting: Neurology

## 2021-01-24 ENCOUNTER — Encounter: Payer: Self-pay | Admitting: Neurology

## 2021-01-24 ENCOUNTER — Ambulatory Visit: Payer: BC Managed Care – PPO | Admitting: Neurology

## 2021-01-24 ENCOUNTER — Encounter: Payer: Self-pay | Admitting: Family Medicine

## 2021-01-24 VITALS — BP 162/100 | HR 74 | Ht 72.0 in | Wt 224.0 lb

## 2021-01-24 DIAGNOSIS — G379 Demyelinating disease of central nervous system, unspecified: Secondary | ICD-10-CM

## 2021-01-24 NOTE — Progress Notes (Signed)
[facility]  PATIENT: James Bautista DOB: May 23, 1980  REFERRING DOCTOR OR PCP:  Dimas Chyle, MD SOURCE: Patient, notes from hospital admission, imaging and laboratory reports, MRI images personally reviewed.  _________________________________   HISTORICAL  CHIEF COMPLAINT:  Chief Complaint  Patient presents with   Follow-up    Rm 1, alone. Here for slurred speech and abnormal brain MRI. Pt reports doing well. Pt is being treated for HBP, since early Aug.     HISTORY OF PRESENT ILLNESS:  James Bautista is a 40 y.o. man who presented with slurred speech and abnormal brain MRI 03/2018.   Update 9/15/20221: He denies any new symptoms since his last visit over a year ago.  Specifically, no difficulties with speech, thought processes sensation, vision, coordination.  We had a long discussion about the large focus in the brain.  It most likely represents a demyelinating event.  As it occurred few weeks after his flu and pneumonia vaccinations and follow-up MRIs have been normal, most likely this was a postvaccination event.  MS is less likely.  Unfortunately, there is little data in situations most similar to his.  We know from the optic neuritis treatment trial that a single demyelinating event with an otherwise normal MRI was associated with about a 25% risk of developing MS and that the majority of people develop MS over the next 2 years (either relapse or changes on MRI).  There were some people who took a couple more years.   Initial symptoms were a few weeks after he received a flu shot (02/09/2018).  We had a long discussion about the Covid vaccination.  There do not appear to be demyelinating events occurring at any significant level after the Bassett or Moderna vaccinations.  Because he works with the general public at close range, the risks of the vaccination are likely lower than the risk of catching Covid.  He had Covid in January 2022.   He had the double dose of  Pfizer vaccine 07/2020  Initial demyelinating event? In mid October 2019, he noted that he started biting the inside of his mouth.   He had a dental appointment which was non-contributary.   He continued to have some inner cheek biting the next couple weeks.   On 03/14/2018, he had the onset of left facial dysesthesias.   He noticed a sinus stuffy sensation.    While speaking, he noticed his voice was slurred.   He works as a Chief Executive Officer.  The next day, while looking in a mirror, he noted his smile was asymmetric, weaker on the left.   Additionally, speech was more slurred.  He presented to the [facility] ED [facility]/15/2018.   CT scan showed a region of edema in the posterior right frontal lobe.[name]   His brain MRI showed a large right subcortical focus with patchy enhancement and restriction on DWI.     He was admitted and had 5 days of IV Solu-Medrol.  Every day, he noted some improvement.   By discharge, slurred speech was much better but there was still some facial > hand numbness.    He did have some problems with left hand coordination for a few weeks, now nearly completely resolved.   His smile is now nearly symmetric he improved completely to baseline over the next few months.   He has never had any other neurologic symptom lasting more than a few minutes.  He has never noted any significant visual symptom.     He has  essential hypertension and diabetes mellitus.  He had the flu-shot and pneumonia vaccination 02/09/2018.   The vaccination was Fluarix quadrivalent Lot [id number]  Exp 11/09/2018   Suring [id number].    There were no antecedent infections.  Imaging: MRI of the brain 03/15/2018  shows a juxtacortical/subcortical large T2/FLAIR hyperintense focus that also showed on diffusion-weighted images.  There was some peripheral enhancement incompletely.  There were no other lesions in the brain .   MRI of the cervical and thoracic spine 03/16/2018 did not show any additional lesions.  There is no spinal cord compression though  he has mild spinal stenosis at C4-C5.  The brain MRI 12/01/2018 showed the single focus concerning for large demyelinating plaque.  It was slightly smaller than previous MRI and there was no enhancement.   Of note, his initial symptoms   REVIEW OF SYSTEMS: Constitutional: No fevers, chills, sweats, or change in appetite Eyes: No visual changes, double vision, eye pain Ear, nose and throat: No hearing loss, ear pain, nasal congestion, sore throat Cardiovascular: No chest pain, palpitations Respiratory:  No shortness of breath at rest or with exertion.   No wheezes GastrointestinaI: No nausea, vomiting, diarrhea, abdominal pain, fecal incontinence Genitourinary:  No dysuria, urinary retention or frequency.  No nocturia. Musculoskeletal:  No neck pain, back pain Integumentary: No rash, pruritus, skin lesions Neurological: as above Psychiatric: No depression at this time.  No anxiety Endocrine: He has diabetes and is on metformin and insulin.  Hematologic/Lymphatic:  No anemia, purpura, petechiae. Allergic/Immunologic: No itchy/runny eyes, nasal congestion, recent allergic reactions, rashes  ALLERGIES: Allergies  Allergen Reactions   Fluogen [Influenza Virus Vaccine]     Brain lesion.    Neomycin Swelling and Other (See Comments)    Neomycin ear drops - ears swelled shut   Banana Rash and Other (See Comments)    "Tastes like eating a porcupine"   Nickel Rash   Peach [Prunus Persica] Rash and Other (See Comments)    "Tastes like eating a porcupine"    HOME MEDICATIONS:  Current Outpatient Medications:    atorvastatin (LIPITOR) 40 MG tablet, Take 1 tablet (40 mg total) by mouth daily., Disp: 90 tablet, Rfl: 3   fexofenadine (ALLEGRA) 180 MG tablet, Take 180 mg by mouth daily as needed for allergies or rhinitis., Disp: , Rfl:    fluticasone (FLONASE) 50 MCG/ACT nasal spray, Place 2 sprays into both nostrils daily as needed for allergies or rhinitis. , Disp: , Rfl:    losartan (COZAAR)  50 MG tablet, Take 1 tablet (50 mg total) by mouth daily., Disp: 90 tablet, Rfl: 3   metFORMIN (GLUCOPHAGE) 1000 MG tablet, Take 1 tablet (1,000 mg total) by mouth daily., Disp: 90 tablet, Rfl: 3  PAST MEDICAL HISTORY: Past Medical History:  Diagnosis Date   Allergy    seasonal   COVID    Diabetes mellitus ([place])    Hyperlipemia    Hypertension    Kidney stone    never passed a stone or had radiographic evidence of stone.    Prostatitis 2010   had to wear catheter for urinary retention. [facility] urologist.     PAST SURGICAL HISTORY: Past Surgical History:  Procedure Laterality Date   FEMUR FRACTURE SURGERY  '92   right leg.   LASIK     WISDOM TOOTH EXTRACTION Bilateral 10/2017    FAMILY HISTORY: Family History  Problem Relation Age of Onset   Diabetes Father    Hyperlipidemia Father    Goiter Sister  Cancer Paternal Aunt        ovarian cancer   Cancer Maternal Grandmother        breast   COPD Maternal Grandmother    Heart disease Paternal Grandmother    Dementia Paternal Grandmother    Thyroid disease Paternal Grandmother    Diabetes Paternal Grandfather    Heart disease Paternal Grandfather    Multiple sclerosis Neg Hx     SOCIAL HISTORY:  Social History   Socioeconomic History   Marital status: Married    Spouse name: Drayson Latimer   Number of children: 0   Years of education: 19   Highest education level: Not on file  Occupational History   Occupation: Trial lawyer  Tobacco Use   Smoking status: Never   Smokeless tobacco: Never  Vaping Use   Vaping Use: Never used  Substance and Sexual Activity   Alcohol use: No    Alcohol/week: 0.0 standard drinks    Comment: 1 drink per month   Drug use: No   Sexual activity: Yes    Partners: Female  Other Topics Concern   Not on file  Social History Narrative   [place] - [place], [employer] 2010. Married. Uses condoms. 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   Social Determinants of  Health   Financial Resource Strain: Not on file  Food Insecurity: Not on file  Transportation Needs: Not on file  Physical Activity: Not on file  Stress: Not on file  Social Connections: Not on file  Intimate Partner Violence: Not on file     PHYSICAL EXAM  Vitals:   01/24/21 1600 01/24/21 1605  BP: (!) 158/103 (!) 162/100  Pulse: 74   Weight: 224 lb (101.6 kg)   Height: 6' (1.829 m)     Body mass index is 30.38 kg/m.   General: The patient is well-developed and well-nourished and in no acute distress  Neurologic Exam  Mental status: The patient is alert and oriented x 3 at the time of the examination. The patient has apparent normal recent and remote memory, with an apparently normal attention span and concentration ability.   Speech is normal.  Cranial nerves: Extraocular movements are full.  Facial strength and sensation is normal.  Trapezius strength is normal.  No dysarthria.   No obvious hearing deficits are noted.  Motor:  Muscle bulk is normal.   Tone is normal. Strength is  5 / 5 in all 4 extremities.   Sensory: Intact sensation to touch and vibration.  Coordination: Cerebellar testing shows good finger-nose-finger and heel-to-shin  Gait and station: Station is normal.   Gait and station are normal.  Romberg is negative.  Reflexes: Deep tendon reflexes are symmetric and normal bilaterally.        ASSESSMENT AND PLAN    1. 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    The MRIs have been stable with no new lesions.  The 2 most likely causes of this would be either a postvaccination syndrome or early multiple sclerosis with a tumefactive lesion.   As more time passes with no additional lesions on MRI or relapses, MS becomes less likely.  I discussed with him getting 1 additional MRI next year (4 years after initial event) if that study is unchanged, I do not think any additional studies will be needed. 2.  We had a long discussion  about future vaccinations.  It is most likely  that his event was postvaccination.  He did get the COVID-19 vaccination without difficulty.  I discussed with him that a reasonable plan would be skip vaccinations for diseases that are unlikely to be life-threatening but to get the vaccinations for life-threatening diseases.  We did discuss that there are many mechanisms of action for vaccinations and for many vaccines his risk may not be higher than the general population. 3.    He will return to see me in 14 months or so.  We will try to arrange for an MRI to be done a couple weeks before the visit so that we can discuss the findings if there are any changes.  He should call sooner if he has any new or worsening neurologic symptoms.  30-minute office visit with the majority of the time spent face-to-face for history and physical, discussion/counseling and decision-making.  Additional time with record review and documentation.   Anntionette Madkins A. Felecia Shelling, MD, [facility] [id number], [id number] PM Certified in Neurology, Clinical Neurophysiology, Sleep Medicine, Pain Medicine and Neuroimaging  [facility] [address] [place], [place] [address] [phone]

## 2021-01-25 NOTE — Telephone Encounter (Signed)
Please advise 

## 2021-03-14 ENCOUNTER — Telehealth (INDEPENDENT_AMBULATORY_CARE_PROVIDER_SITE_OTHER): Payer: BC Managed Care – PPO | Admitting: Family Medicine

## 2021-03-14 ENCOUNTER — Other Ambulatory Visit: Payer: Self-pay

## 2021-03-14 VITALS — BP 150/95 | HR 90 | Ht 72.0 in | Wt 218.0 lb

## 2021-03-14 DIAGNOSIS — R059 Cough, unspecified: Secondary | ICD-10-CM

## 2021-03-14 DIAGNOSIS — E1159 Type 2 diabetes mellitus with other circulatory complications: Secondary | ICD-10-CM

## 2021-03-14 DIAGNOSIS — I152 Hypertension secondary to endocrine disorders: Secondary | ICD-10-CM | POA: Diagnosis not present

## 2021-03-14 NOTE — Patient Instructions (Signed)
It was very nice to see you today!  You have covid.  Please let us know if you have any worsening in symptoms.  Please increase your losartan to 100 mg daily.  Send me a message on MyChart in a few weeks to let me know how this is looking.  Take care, Dr Jerline Pain  PLEASE NOTE:  If you had any lab tests please let us know if you have not heard back within a few days. You may see your results on mychart before we have a chance to review them but we will give you a call once they are reviewed by [place]. If we ordered any referrals today, please let us know if you have not heard from their office within the next week.   Please try these tips to maintain a healthy lifestyle:  Eat at least 3 REAL meals and 1-2 snacks per day.  Aim for no more than 5 hours between eating.  If you eat breakfast, please do so within one hour of getting up.   Each meal should contain half fruits/vegetables, one quarter protein, and one quarter carbs (no bigger than a computer mouse)  Cut down on sweet beverages. This includes juice, soda, and sweet tea.   Drink at least 1 glass of water with each meal and aim for at least 8 glasses per day  Exercise at least 150 minutes every week.

## 2021-03-14 NOTE — Progress Notes (Signed)
   James Bautista is a 40 y.o. male who presents today for an office visit.  Assessment/Plan:  New/Acute Problems: COVID Rapid COVID test positive.  He does not wish to try antivirals at this point.  We will continue over-the-counter meds and conservative management.  Discussed return to work protocol per [employer] guidelines.  He will let me know if symptoms or not improving.  Chronic Problems Addressed Today: Hypertension associated with diabetes ([place]) Elevated today.  He has had persistently elevated readings for the last several weeks.  We will increase his losartan to 100 mg daily.  He will check in with me in a few weeks via MyChart.     Subjective:  HPI:  Symptoms started yesterday. Has had an exposure to covid and flu. No fevers or chills. Has had cough and congestion. Symptoms are manageable currently.  He is concerned about exposing others.        Objective:  Physical Exam: BP (!) 150/95   Pulse 90   Ht 6' (1.829 m)   Wt 218 lb (98.9 kg)   SpO2 99%   BMI 29.57 kg/m   Gen: No acute distress, resting comfortably CV: Regular rate and rhythm with no murmurs appreciated Pulm: Normal work of breathing, clear to auscultation bilaterally with no crackles, wheezes, or rhonchi Neuro: Grossly normal, moves all extremities Psych: Normal affect and thought content      Drakkar Medeiros M. Jerline Pain, MD 03/14/2021 4:17 PM

## 2021-03-14 NOTE — Assessment & Plan Note (Signed)
Elevated today.  He has had persistently elevated readings for the last several weeks.  We will increase his losartan to 100 mg daily.  He will check in with me in a few weeks via MyChart.

## 2021-03-28 ENCOUNTER — Ambulatory Visit: Payer: BC Managed Care – PPO | Admitting: Family Medicine

## 2021-04-30 ENCOUNTER — Encounter: Payer: Self-pay | Admitting: Family Medicine

## 2021-04-30 MED ORDER — LOSARTAN POTASSIUM 100 MG PO TABS: 100.0000 mg | ORAL_TABLET | Freq: Every day | ORAL | 1 refills | Status: DC

## 2021-04-30 NOTE — Telephone Encounter (Signed)
Please see message and pt needs refill.

## 2021-04-30 NOTE — Telephone Encounter (Signed)
His number are still a bit above where I would like. Can we ask him to schedule an office visit soon?  [name]. Jerline Pain, MD 04/30/2021 9:09 AM

## 2021-08-25 ENCOUNTER — Other Ambulatory Visit: Payer: Self-pay | Admitting: Family Medicine

## 2021-11-21 ENCOUNTER — Ambulatory Visit: Payer: BC Managed Care – PPO | Admitting: Family Medicine

## 2021-11-21 VITALS — BP 150/100 | HR 91 | Temp 98.0°F | Ht 72.0 in | Wt 222.8 lb

## 2021-11-21 DIAGNOSIS — E1159 Type 2 diabetes mellitus with other circulatory complications: Secondary | ICD-10-CM

## 2021-11-21 DIAGNOSIS — I152 Hypertension secondary to endocrine disorders: Secondary | ICD-10-CM | POA: Diagnosis not present

## 2021-11-21 DIAGNOSIS — E119 Type 2 diabetes mellitus without complications: Secondary | ICD-10-CM | POA: Diagnosis not present

## 2021-11-21 LAB — POCT GLYCOSYLATED HEMOGLOBIN (HGB A1C): Hemoglobin A1C: 10.1 % — AB (ref 4.0–5.6)

## 2021-11-21 MED ORDER — LOSARTAN POTASSIUM 100 MG PO TABS: 100.0000 mg | ORAL_TABLET | Freq: Every day | ORAL | 1 refills | Status: DC

## 2021-11-21 MED ORDER — AMLODIPINE BESYLATE 5 MG PO TABS: 5.0000 mg | ORAL_TABLET | Freq: Every day | ORAL | 3 refills | Status: DC

## 2021-11-21 MED ORDER — METFORMIN HCL 1000 MG PO TABS: ORAL_TABLET | ORAL | 3 refills | Status: DC

## 2021-11-21 MED ORDER — TIRZEPATIDE 2.5 MG/0.5ML ~~LOC~~ SOAJ: 2.5000 mg | SUBCUTANEOUS | 0 refills | Status: DC

## 2021-11-21 NOTE — Progress Notes (Signed)
   James Bautista is a 41 y.o. male who presents today for an office visit.  Assessment/Plan:  Chronic Problems Addressed Today: Hypertension associated with diabetes ([place]) Elevated today.  Has been under a lot of stress recently.  Some readings around the same.  We will add on amlodipine 5 mg daily.  Discussed potential side effects.  Continue losartan 100 mg daily.  He will follow-up with me in a few weeks via MyChart.  He will come back for next office visit in 3 months.  Type 2 diabetes mellitus (HCC) A1c 10.1.  He has been under a lot of stress recently which is likely contributing.  We will add on Mounjaro to his regimen.  Started 2.5 mg weekly.  We discussed potential side effects.  Continue metformin 1000 mg daily.  He will follow-up in a few weeks via MyChart.  Recheck A1c in 3 months.    Subjective:  HPI:  See A/p for status of chronic conditions.         Objective:  Physical Exam: BP (!) 150/100   Pulse 91   Temp 98 F (36.7 C) (Temporal)   Ht 6' (1.829 m)   Wt 222 lb 12.8 oz (101.1 kg)   SpO2 99%   BMI 30.22 kg/m   Wt Readings from Last 3 Encounters:  11/21/21 222 lb 12.8 oz (101.1 kg)  03/14/21 218 lb (98.9 kg)  01/24/21 224 lb (101.6 kg)  Gen: No acute distress, resting comfortably Neuro: Grossly normal, moves all extremities Psych: Normal affect and thought content      Mandalyn Pasqua M. Jerline Pain, MD 11/21/2021 3:38 PM

## 2021-11-21 NOTE — Patient Instructions (Signed)
It was very nice to see you today!  Please start the amlodipine and the mounjaro.  Please send me a message in a few weeks to let me know how you are doing.   Take care, Dr Jerline Pain  PLEASE NOTE:  If you had any lab tests please let us know if you have not heard back within a few days. You may see your results on mychart before we have a chance to review them but we will give you a call once they are reviewed by [place]. If we ordered any referrals today, please let us know if you have not heard from their office within the next week.   Please try these tips to maintain a healthy lifestyle:  Eat at least 3 REAL meals and 1-2 snacks per day.  Aim for no more than 5 hours between eating.  If you eat breakfast, please do so within one hour of getting up.   Each meal should contain half fruits/vegetables, one quarter protein, and one quarter carbs (no bigger than a computer mouse)  Cut down on sweet beverages. This includes juice, soda, and sweet tea.   Drink at least 1 glass of water with each meal and aim for at least 8 glasses per day  Exercise at least 150 minutes every week.

## 2021-11-21 NOTE — Assessment & Plan Note (Signed)
Elevated today.  Has been under a lot of stress recently.  Some readings around the same.  We will add on amlodipine 5 mg daily.  Discussed potential side effects.  Continue losartan 100 mg daily.  He will follow-up with me in a few weeks via MyChart.  He will come back for next office visit in 3 months.

## 2021-11-21 NOTE — Assessment & Plan Note (Signed)
A1c 10.1.  He has been under a lot of stress recently which is likely contributing.  We will add on Mounjaro to his regimen.  Started 2.5 mg weekly.  We discussed potential side effects.  Continue metformin 1000 mg daily.  He will follow-up in a few weeks via MyChart.  Recheck A1c in 3 months.

## 2021-12-19 ENCOUNTER — Encounter: Payer: Self-pay | Admitting: Family Medicine

## 2021-12-19 NOTE — Telephone Encounter (Signed)
See note

## 2021-12-23 NOTE — Telephone Encounter (Signed)
I appreciate the update. It is fine if he wants to continue injecting into his thigh. He should continue the low dose for a total of 4 weeks before we discuss increasing the dose.  [name]. Jerline Pain, MD 12/23/2021 8:15 AM

## 2021-12-30 NOTE — Telephone Encounter (Signed)
I appreciate the update. I am glad he is doing well. We can increase to '5mg'$  once weekly. Please send in new rx.  [name]. Jerline Pain, MD 12/30/2021 12:29 PM

## 2021-12-31 ENCOUNTER — Other Ambulatory Visit: Payer: Self-pay | Admitting: *Deleted

## 2021-12-31 ENCOUNTER — Encounter: Payer: Self-pay | Admitting: Family Medicine

## 2021-12-31 MED ORDER — TIRZEPATIDE 2.5 MG/0.5ML ~~LOC~~ SOAJ: 2.5000 mg | SUBCUTANEOUS | 1 refills | Status: DC

## 2022-02-03 ENCOUNTER — Telehealth: Payer: Self-pay | Admitting: *Deleted

## 2022-02-03 ENCOUNTER — Other Ambulatory Visit: Payer: Self-pay | Admitting: *Deleted

## 2022-02-03 MED ORDER — TIRZEPATIDE 5 MG/0.5ML ~~LOC~~ SOAJ: 5.0000 mg | SUBCUTANEOUS | 1 refills | Status: DC

## 2022-02-03 NOTE — Telephone Encounter (Signed)
Key: [id number] - Rx #: [id number] Outcome Approved today Effective from 02/03/2022 through 02/02/2023. Pharmacy notified

## 2022-02-21 ENCOUNTER — Other Ambulatory Visit: Payer: Self-pay | Admitting: Family Medicine

## 2022-05-26 ENCOUNTER — Other Ambulatory Visit: Payer: Self-pay | Admitting: Family Medicine

## 2022-05-26 ENCOUNTER — Ambulatory Visit: Payer: BC Managed Care – PPO | Admitting: Family Medicine

## 2022-06-04 ENCOUNTER — Ambulatory Visit: Payer: BC Managed Care – PPO | Admitting: Family Medicine

## 2022-06-04 VITALS — BP 137/84 | HR 91 | Temp 98.2°F | Ht 72.0 in | Wt 189.0 lb

## 2022-06-04 DIAGNOSIS — E1159 Type 2 diabetes mellitus with other circulatory complications: Secondary | ICD-10-CM | POA: Diagnosis not present

## 2022-06-04 DIAGNOSIS — I152 Hypertension secondary to endocrine disorders: Secondary | ICD-10-CM | POA: Diagnosis not present

## 2022-06-04 DIAGNOSIS — E1169 Type 2 diabetes mellitus with other specified complication: Secondary | ICD-10-CM

## 2022-06-04 DIAGNOSIS — R059 Cough, unspecified: Secondary | ICD-10-CM | POA: Diagnosis not present

## 2022-06-04 DIAGNOSIS — E785 Hyperlipidemia, unspecified: Secondary | ICD-10-CM

## 2022-06-04 DIAGNOSIS — E119 Type 2 diabetes mellitus without complications: Secondary | ICD-10-CM

## 2022-06-04 LAB — POCT GLYCOSYLATED HEMOGLOBIN (HGB A1C): Hemoglobin A1C: 5.1 % (ref 4.0–5.6)

## 2022-06-04 LAB — CBC
HCT: 45 % (ref 39.0–52.0)
Hemoglobin: 15.4 g/dL (ref 13.0–17.0)
MCHC: 34.3 g/dL (ref 30.0–36.0)
MCV: 78.1 fl (ref 78.0–100.0)
Platelets: 206 10*3/uL (ref 150.0–400.0)
RBC: 5.76 Mil/uL (ref 4.22–5.81)
RDW: 15 % (ref 11.5–15.5)
WBC: 10.4 10*3/uL (ref 4.0–10.5)

## 2022-06-04 LAB — COMPREHENSIVE METABOLIC PANEL
ALT: 18 U/L (ref 0–53)
AST: 15 U/L (ref 0–37)
Albumin: 4.4 g/dL (ref 3.5–5.2)
Alkaline Phosphatase: 109 U/L (ref 39–117)
BUN: 14 mg/dL (ref 6–23)
CO2: 29 mEq/L (ref 19–32)
Calcium: 9.1 mg/dL (ref 8.4–10.5)
Chloride: 96 mEq/L (ref 96–112)
Creatinine, Ser: 0.96 mg/dL (ref 0.40–1.50)
GFR: 99.8 mL/min (ref >60.00)
Glucose, Bld: 74 mg/dL (ref 70–99)
Potassium: 4 mEq/L (ref 3.5–5.1)
Sodium: 135 mEq/L (ref 135–145)
Total Bilirubin: 0.8 mg/dL (ref 0.2–1.2)
Total Protein: 7 g/dL (ref 6.0–8.3)

## 2022-06-04 LAB — LIPID PANEL
Cholesterol: 104 mg/dL (ref 0–200)
HDL: 33.1 mg/dL — ABNORMAL LOW (ref >39.00)
LDL Cholesterol: 44 mg/dL (ref 0–99)
NonHDL: 70.97
Total CHOL/HDL Ratio: 3
Triglycerides: 133 mg/dL (ref 0.0–149.0)
VLDL: 26.6 mg/dL (ref 0.0–40.0)

## 2022-06-04 LAB — MICROALBUMIN / CREATININE URINE RATIO
Creatinine,U: 47 mg/dL
Microalb Creat Ratio: 1.5 mg/g (ref 0.0–30.0)
Microalb, Ur: <0.7 mg/dL (ref 0.0–1.9)

## 2022-06-04 LAB — TSH: TSH: 1.23 u[IU]/mL (ref 0.35–5.50)

## 2022-06-04 LAB — POC COVID19 BINAXNOW: SARS Coronavirus 2 Ag: NEGATIVE

## 2022-06-04 NOTE — Assessment & Plan Note (Signed)
On Lipitor 40 mg daily.  Check labs.

## 2022-06-04 NOTE — Progress Notes (Signed)
   James Bautista is a 42 y.o. male who presents today for an office visit.  Assessment/Plan:  New/Acute Problems: Cough Covid negative.  No red flags.  Reassuring exam today.  Likely viral URI and could have been COVID that has resolved.  Is reassuring that symptoms are improving.  He will continue with conservative management.  He will let me know if not improving over the next couple of weeks.  Chronic Problems Addressed Today: Type 2 diabetes mellitus (HCC) A1c much better at 5.1.  Doing well with Mounjaro.  Continue 5 mg weekly.  We will stop metformin.  He will continue working on diet and exercise.  He will come back in 3 to 6 months and we can recheck A1c at that time.  Dyslipidemia associated with type 2 diabetes mellitus (HCC) On Lipitor 40 mg daily.  Check labs.  Hypertension associated with diabetes ([place]) Initially elevated however at goal on recheck.  Continue amlodipine 5 mg daily and losartan 100 mg daily.  He will continue monitor at home.  Hopefully with his weight loss we will be able to discontinue or wean off antihypertensives going forward.     Subjective:  HPI:  See A/p for status of chronic conditions.  He is here today for diabetes follow up. We saw him 6 months ago. A1c was not controlled at that point. We added on mounjaro to his regimen. He has done well with this. Does have occasional nausea but this is manageable.  He has been eating less. He does feel like he is getting filled up faster than previously.   Concerned for covid. Started having congestion and cough about a week ago. Some brain fog.  Some fatigue. Works in a courthouse. No obvious sick contacts.        Objective:  Physical Exam: BP 137/84   Pulse 91   Temp 98.2 F (36.8 C) (Temporal)   Ht 6' (1.829 m)   Wt 189 lb (85.7 kg)   SpO2 99%   BMI 25.63 kg/m   Wt Readings from Last 3 Encounters:  06/04/22 189 lb (85.7 kg)  11/21/21 222 lb 12.8 oz (101.1 kg)  03/14/21 218 lb (98.9 kg)     Gen: No acute distress, resting comfortably HEENT: Tms clear. Op clear.  CV: Regular rate and rhythm with no murmurs appreciated Pulm: Normal work of breathing, clear to auscultation bilaterally with no crackles, wheezes, or rhonchi Neuro: Grossly normal, moves all extremities Psych: Normal affect and thought content      Orville Widmann M. Jerline Pain, MD 06/04/2022 10:10 AM

## 2022-06-04 NOTE — Assessment & Plan Note (Signed)
A1c much better at 5.1.  Doing well with Mounjaro.  Continue 5 mg weekly.  We will stop metformin.  He will continue working on diet and exercise.  He will come back in 3 to 6 months and we can recheck A1c at that time.

## 2022-06-04 NOTE — Patient Instructions (Signed)
It was very nice to see you today!  Your A1c is great!   Please keep up the good work.  You can stop your metformin.  Please continue with Mounjaro 5 mg weekly.  Please continue to monitor your blood pressure at home.  Your COVID test today is negative.  Let me know if this is not improving over the next couple of weeks.  Will check blood work today.  Will see back in 6 months.  Come back sooner if needed.  Take care, Dr Jerline Pain  PLEASE NOTE:  If you had any lab tests, please let us know if you have not heard back within a few days. You may see your results on mychart before we have a chance to review them but we will give you a call once they are reviewed by [place].   If we ordered any referrals today, please let us know if you have not heard from their office within the next week.   If you had any urgent prescriptions sent in today, please check with the pharmacy within an hour of our visit to make sure the prescription was transmitted appropriately.   Please try these tips to maintain a healthy lifestyle:  Eat at least 3 REAL meals and 1-2 snacks per day.  Aim for no more than 5 hours between eating.  If you eat breakfast, please do so within one hour of getting up.   Each meal should contain half fruits/vegetables, one quarter protein, and one quarter carbs (no bigger than a computer mouse)  Cut down on sweet beverages. This includes juice, soda, and sweet tea.   Drink at least 1 glass of water with each meal and aim for at least 8 glasses per day  Exercise at least 150 minutes every week.

## 2022-06-04 NOTE — Assessment & Plan Note (Signed)
Initially elevated however at goal on recheck.  Continue amlodipine 5 mg daily and losartan 100 mg daily.  He will continue monitor at home.  Hopefully with his weight loss we will be able to discontinue or wean off antihypertensives going forward.

## 2022-06-06 NOTE — Progress Notes (Signed)
Please inform patient of the following:  Labs all look great.  Cholesterol much better than last time.  Would like for him to continue current medications for now but will at some point we can consider decreasing dose of his statin.  We can recheck all of this in a year.  I would like to see him back In 3 to 6 months to recheck his A1c.

## 2022-06-29 ENCOUNTER — Encounter: Payer: Self-pay | Admitting: Family Medicine

## 2022-06-30 NOTE — Telephone Encounter (Signed)
I appreciate the update. I am sorry to hear he is having issues! It is safe for him to go back to the metformin if he felt better on it. Please send in new rx if needed. He should let us know if his symptoms do not resolve after restarting his medication.  [name]. Jerline Pain, MD 06/30/2022 7:47 AM

## 2022-07-01 NOTE — Telephone Encounter (Signed)
Left message to return call to our office at their convenience.  

## 2022-07-02 NOTE — Telephone Encounter (Signed)
Patient returned call, stated it was possible symptoms was from stomach bug Will try to be off metformin this week, will let PCP know how he is doing

## 2022-07-13 ENCOUNTER — Other Ambulatory Visit: Payer: Self-pay | Admitting: Family Medicine

## 2022-08-20 ENCOUNTER — Other Ambulatory Visit: Payer: Self-pay | Admitting: Family Medicine

## 2022-09-15 ENCOUNTER — Telehealth: Payer: Self-pay | Admitting: Family Medicine

## 2022-09-15 ENCOUNTER — Encounter: Payer: Self-pay | Admitting: Family Medicine

## 2022-09-15 NOTE — Telephone Encounter (Signed)
Pt states they are out of  tirzepatide [facility]) 5 MG/0.5ML Pen  At all pharmacies, he was wondering if we can RX the 7.5. They have that at the [facility] - [address] in [place]. Please call pt back with details.

## 2022-09-16 ENCOUNTER — Other Ambulatory Visit: Payer: Self-pay | Admitting: *Deleted

## 2022-09-16 MED ORDER — TIRZEPATIDE 7.5 MG/0.5ML ~~LOC~~ SOAJ: 7.5000 mg | SUBCUTANEOUS | 1 refills | Status: DC

## 2022-09-16 NOTE — Telephone Encounter (Signed)
Please advise 

## 2022-09-16 NOTE — Telephone Encounter (Signed)
Send to PCP in patient advise, duplicate call

## 2022-09-16 NOTE — Telephone Encounter (Signed)
Ok to send in 7.5 mg weekly dose.  James Bautista. [name], MD 09/16/2022 7:32 AM

## 2022-12-10 ENCOUNTER — Encounter: Payer: Self-pay | Admitting: Family Medicine

## 2022-12-10 ENCOUNTER — Ambulatory Visit: Payer: BC Managed Care – PPO | Admitting: Family Medicine

## 2022-12-10 VITALS — BP 130/80 | HR 80 | Temp 98.0°F | Ht 72.0 in | Wt 192.4 lb

## 2022-12-10 DIAGNOSIS — E1169 Type 2 diabetes mellitus with other specified complication: Secondary | ICD-10-CM | POA: Diagnosis not present

## 2022-12-10 DIAGNOSIS — Z7985 Long-term (current) use of injectable non-insulin antidiabetic drugs: Secondary | ICD-10-CM | POA: Diagnosis not present

## 2022-12-10 DIAGNOSIS — E785 Hyperlipidemia, unspecified: Secondary | ICD-10-CM

## 2022-12-10 DIAGNOSIS — E1159 Type 2 diabetes mellitus with other circulatory complications: Secondary | ICD-10-CM | POA: Diagnosis not present

## 2022-12-10 DIAGNOSIS — I152 Hypertension secondary to endocrine disorders: Secondary | ICD-10-CM | POA: Diagnosis not present

## 2022-12-10 DIAGNOSIS — E119 Type 2 diabetes mellitus without complications: Secondary | ICD-10-CM

## 2022-12-10 LAB — POCT GLYCOSYLATED HEMOGLOBIN (HGB A1C): Hemoglobin A1C: 5.2 % (ref 4.0–5.6)

## 2022-12-10 MED ORDER — ATORVASTATIN CALCIUM 40 MG PO TABS: 40.0000 mg | ORAL_TABLET | Freq: Every day | ORAL | 3 refills | Status: DC

## 2022-12-10 MED ORDER — TIRZEPATIDE 7.5 MG/0.5ML ~~LOC~~ SOAJ: 7.5000 mg | SUBCUTANEOUS | 1 refills | Status: DC

## 2022-12-10 MED ORDER — LOSARTAN POTASSIUM 100 MG PO TABS: 100.0000 mg | ORAL_TABLET | Freq: Every day | ORAL | 3 refills | Status: DC

## 2022-12-10 MED ORDER — AMLODIPINE BESYLATE 5 MG PO TABS: 5.0000 mg | ORAL_TABLET | Freq: Every day | ORAL | 3 refills | Status: DC

## 2022-12-10 NOTE — Progress Notes (Signed)
   James Bautista is a 42 y.o. male who presents today for an office visit.  Assessment/Plan:  Chronic Problems Addressed Today: Type 2 diabetes mellitus (HCC) A1c well-controlled 5.2.  Doing well with Mounjaro.  Continue 7.5 mg weekly.  Continue diet and exercise as well.  Recheck 6 months at CPE.  Dyslipidemia associated with type 2 diabetes mellitus (HCC) Will refill Lipitor 40 mg daily.  Recheck lipids 6 months at CPE.  Hypertension associated with diabetes (HCC) Initially elevated however at goal on recheck.  Continue amlodipine 5 mg daily, losartan 100 mg daily.  Will monitor at home as well.  Recheck in 6 months at CPE.  Preventative health care Will be due for tetanus vaccine soon however due to prior history of demyelinating disease would be reasonable for him to hold off on this at this point.  Has very low risk of exposure to tetanus due to his occupation.    Subjective:  HPI:  See A/P for status of chronic conditions.  Patient is here today for follow-up.  Seen 6 months ago.  A1c at that time was 5.1.  We continued him on Mounjaro 5 mg weekly and stop metformin.  We also continued his other current medications including amlodipine 5 mg daily, losartan milligrams daily, and Lipitor 40 mg daily.  We did send in 7.5 mg dose of Mounjaro a couple of months ago.  He has done well with this.  No significant side effects.       Objective:  Physical Exam: BP 130/80   Pulse 80   Temp 98 F (36.7 C) (Temporal)   Ht 6' (1.829 m)   Wt 192 lb 6.4 oz (87.3 kg)   SpO2 100%   BMI 26.09 kg/m   Wt Readings from Last 3 Encounters:  12/10/22 192 lb 6.4 oz (87.3 kg)  06/04/22 189 lb (85.7 kg)  11/21/21 222 lb 12.8 oz (101.1 kg)    Gen: No acute distress, resting comfortably CV: Regular rate and rhythm with no murmurs appreciated Pulm: Normal work of breathing, clear to auscultation bilaterally with no crackles, wheezes, or rhonchi Neuro: Grossly normal, moves all extremities Psych:  Normal affect and thought content      Christy Friede M. [name], MD 12/10/2022 10:43 AM

## 2022-12-10 NOTE — Assessment & Plan Note (Signed)
Will refill Lipitor 40 mg daily.  Recheck lipids 6 months at CPE.

## 2022-12-10 NOTE — Patient Instructions (Signed)
It was very nice to see you today!  I am glad you are doing well.  I will refill your medications today.  Please continue to monitor your blood pressure at home.  Return in about 6 months (around 06/12/2023) for Annual Physical.   Take care, Dr [name]  PLEASE NOTE:  If you had any lab tests, please let us know if you have not heard back within a few days. You may see your results on mychart before we have a chance to review them but we will give you a call once they are reviewed by [place].   If we ordered any referrals today, please let us know if you have not heard from their office within the next week.   If you had any urgent prescriptions sent in today, please check with the pharmacy within an hour of our visit to make sure the prescription was transmitted appropriately.   Please try these tips to maintain a healthy lifestyle:  Eat at least 3 REAL meals and 1-2 snacks per day.  Aim for no more than 5 hours between eating.  If you eat breakfast, please do so within one hour of getting up.   Each meal should contain half fruits/vegetables, one quarter protein, and one quarter carbs (no bigger than a computer mouse)  Cut down on sweet beverages. This includes juice, soda, and sweet tea.   Drink at least 1 glass of water with each meal and aim for at least 8 glasses per day  Exercise at least 150 minutes every week.

## 2022-12-10 NOTE — Assessment & Plan Note (Signed)
Initially elevated however at goal on recheck.  Continue amlodipine 5 mg daily, losartan 100 mg daily.  Will monitor at home as well.  Recheck in 6 months at CPE.

## 2022-12-10 NOTE — Assessment & Plan Note (Signed)
A1c well-controlled 5.2.  Doing well with Mounjaro.  Continue 7.5 mg weekly.  Continue diet and exercise as well.  Recheck 6 months at CPE.

## 2022-12-16 ENCOUNTER — Ambulatory Visit: Payer: BC Managed Care – PPO | Admitting: Family Medicine

## 2023-02-03 ENCOUNTER — Other Ambulatory Visit (HOSPITAL_COMMUNITY): Payer: Self-pay

## 2023-08-26 ENCOUNTER — Other Ambulatory Visit: Payer: Self-pay | Admitting: *Deleted

## 2023-08-26 MED ORDER — TIRZEPATIDE 7.5 MG/0.5ML ~~LOC~~ SOAJ: 7.5000 mg | SUBCUTANEOUS | 1 refills | Status: DC

## 2023-12-18 ENCOUNTER — Other Ambulatory Visit: Payer: Self-pay | Admitting: *Deleted

## 2023-12-18 MED ORDER — ATORVASTATIN CALCIUM 40 MG PO TABS
40.0000 mg | ORAL_TABLET | Freq: Every day | ORAL | 0 refills | Status: DC
Start: 2023-12-18 — End: 2024-01-05

## 2024-01-05 ENCOUNTER — Ambulatory Visit: Admitting: Family Medicine

## 2024-01-05 ENCOUNTER — Encounter: Payer: Self-pay | Admitting: Family Medicine

## 2024-01-05 VITALS — BP 116/72 | HR 87 | Temp 98.2°F | Ht 72.0 in | Wt 190.0 lb

## 2024-01-05 DIAGNOSIS — K219 Gastro-esophageal reflux disease without esophagitis: Secondary | ICD-10-CM | POA: Diagnosis not present

## 2024-01-05 DIAGNOSIS — E1159 Type 2 diabetes mellitus with other circulatory complications: Secondary | ICD-10-CM | POA: Diagnosis not present

## 2024-01-05 DIAGNOSIS — I152 Hypertension secondary to endocrine disorders: Secondary | ICD-10-CM

## 2024-01-05 DIAGNOSIS — Z7985 Long-term (current) use of injectable non-insulin antidiabetic drugs: Secondary | ICD-10-CM | POA: Diagnosis not present

## 2024-01-05 DIAGNOSIS — E1169 Type 2 diabetes mellitus with other specified complication: Secondary | ICD-10-CM

## 2024-01-05 DIAGNOSIS — E785 Hyperlipidemia, unspecified: Secondary | ICD-10-CM

## 2024-01-05 DIAGNOSIS — E119 Type 2 diabetes mellitus without complications: Secondary | ICD-10-CM

## 2024-01-05 LAB — COMPREHENSIVE METABOLIC PANEL WITH GFR
ALT: 39 U/L (ref 0–53)
AST: 22 U/L (ref 0–37)
Albumin: 4.6 g/dL (ref 3.5–5.2)
Alkaline Phosphatase: 112 U/L (ref 39–117)
BUN: 15 mg/dL (ref 6–23)
CO2: 29 meq/L (ref 19–32)
Calcium: 9.3 mg/dL (ref 8.4–10.5)
Chloride: 101 meq/L (ref 96–112)
Creatinine, Ser: 1 mg/dL (ref 0.40–1.50)
GFR: 93.97 mL/min (ref >60.00)
Glucose, Bld: 85 mg/dL (ref 70–99)
Potassium: 4.1 meq/L (ref 3.5–5.1)
Sodium: 139 meq/L (ref 135–145)
Total Bilirubin: 0.8 mg/dL (ref 0.2–1.2)
Total Protein: 7.8 g/dL (ref 6.0–8.3)

## 2024-01-05 LAB — LIPID PANEL
Cholesterol: 98 mg/dL (ref 0–200)
HDL: 39.4 mg/dL (ref >39.00)
LDL Cholesterol: 22 mg/dL (ref 0–99)
NonHDL: 58.28
Total CHOL/HDL Ratio: 2
Triglycerides: 181 mg/dL — ABNORMAL HIGH (ref 0.0–149.0)
VLDL: 36.2 mg/dL (ref 0.0–40.0)

## 2024-01-05 LAB — TSH: TSH: 2.16 u[IU]/mL (ref 0.35–5.50)

## 2024-01-05 LAB — CBC
HCT: 47.5 % (ref 39.0–52.0)
Hemoglobin: 16.3 g/dL (ref 13.0–17.0)
MCHC: 34.3 g/dL (ref 30.0–36.0)
MCV: 79.4 fl (ref 78.0–100.0)
Platelets: 171 K/uL (ref 150.0–400.0)
RBC: 5.98 Mil/uL — ABNORMAL HIGH (ref 4.22–5.81)
RDW: 15 % (ref 11.5–15.5)
WBC: 7.9 K/uL (ref 4.0–10.5)

## 2024-01-05 LAB — HEMOGLOBIN A1C: Hgb A1c MFr Bld: 5.6 % (ref 4.6–6.5)

## 2024-01-05 MED ORDER — ATORVASTATIN CALCIUM 40 MG PO TABS: 40.0000 mg | ORAL_TABLET | Freq: Every day | ORAL | 3 refills | Status: AC

## 2024-01-05 MED ORDER — TIRZEPATIDE 7.5 MG/0.5ML ~~LOC~~ SOAJ: 7.5000 mg | SUBCUTANEOUS | 3 refills | Status: AC

## 2024-01-05 MED ORDER — LOSARTAN POTASSIUM 100 MG PO TABS: 100.0000 mg | ORAL_TABLET | Freq: Every day | ORAL | 3 refills | Status: AC

## 2024-01-05 NOTE — Assessment & Plan Note (Signed)
 Likely due to side effect of the Mounjaro .  Recommended 2-week trial of over-the-counter omeprazole or Nexium.  He will let us  know if not improving.  If this continues to be a recurrent issue would consider daily famotidine over PPI.

## 2024-01-05 NOTE — Assessment & Plan Note (Signed)
 Doing well.  Will continue Lipitor 40 mg daily.  Check lipids.

## 2024-01-05 NOTE — Assessment & Plan Note (Signed)
 Very well controlled today.  He is down about 30 pounds over the last few years.  We will stop his amlodipine .  Continue losartan  100 mg daily.  He will monitor at home and follow-up with us  in a few weeks if elevated.

## 2024-01-05 NOTE — Patient Instructions (Signed)
 It was very nice to see you today!  VISIT SUMMARY: Today, you had your annual checkup. Your weight is stable, and your blood pressure and diabetes are well-controlled. We discussed your medications and some recent symptoms you've been experiencing.  YOUR PLAN: TYPE 2 DIABETES MELLITUS: Your diabetes is being managed with Mounjaro , and your blood sugar levels are stable. -Continue taking Mounjaro  7.5 mg weekly. -We will perform blood work to check your A1c levels.  HYPERTENSION: Your blood pressure is well-controlled with your current medications, but you prefer to stop taking Amlodipine  due to side effects. -Discontinue Amlodipine  5 mg daily. -Continue taking Losartan  100 mg daily. -Monitor your blood pressure at home and send a message if it starts to trend upward.  DYSLIPIDEMIA: Your cholesterol levels are being managed with Lipitor. -Continue taking Lipitor 40 mg daily.  GASTROESOPHAGEAL REFLUX DISEASE: You have been experiencing chest burning, especially in the evenings, which may be related to your diabetes medication. -Start taking Nexium for 14 days. -If symptoms return after the Nexium course, consider switching to Pepcid. -Send a message if your symptoms persist or worsen.  Return in about 1 year (around 01/04/2025) for Annual Physical.   Take care, Dr Kennyth  PLEASE NOTE:  If you had any lab tests, please let us  know if you have not heard back within a few days. You may see your results on mychart before we have a chance to review them but we will give you a call once they are reviewed by us .   If we ordered any referrals today, please let us  know if you have not heard from their office within the next week.   If you had any urgent prescriptions sent in today, please check with the pharmacy within an hour of our visit to make sure the prescription was transmitted appropriately.   Please try these tips to maintain a healthy lifestyle:  Eat at least 3 REAL meals and 1-2  snacks per day.  Aim for no more than 5 hours between eating.  If you eat breakfast, please do so within one hour of getting up.   Each meal should contain half fruits/vegetables, one quarter protein, and one quarter carbs (no bigger than a computer mouse)  Cut down on sweet beverages. This includes juice, soda, and sweet tea.   Drink at least 1 glass of water with each meal and aim for at least 8 glasses per day  Exercise at least 150 minutes every week.

## 2024-01-05 NOTE — Progress Notes (Signed)
 James Bautista is a 43 y.o. male who presents today for an office visit.  Assessment/Plan:  Chronic Problems Addressed Today: Hypertension associated with diabetes (HCC) Very well controlled today.  He is down about 30 pounds over the last few years.  We will stop his amlodipine .  Continue losartan  100 mg daily.  He will monitor at home and follow-up with us  in a few weeks if elevated.  Dyslipidemia associated with type 2 diabetes mellitus (HCC) Doing well.  Will continue Lipitor 40 mg daily.  Check lipids.  Type 2 diabetes mellitus (HCC) Check A1c with labs.  Doing well with Mounjaro  7.5 mg weekly.  GERD (gastroesophageal reflux disease) Likely due to side effect of the Mounjaro .  Recommended 2-week trial of over-the-counter omeprazole or Nexium.  He will let us  know if not improving.  If this continues to be a recurrent issue would consider daily famotidine over PPI.     Subjective:  HPI:  See assessment / plan for status of chronic conditions.    Discussed the use of AI scribe software for clinical note transcription with the patient, who gave verbal consent to proceed.  History of Present Illness James Bautista is a 43 year old male with type 2 diabetes and hypertension who presents for an annual checkup.  He is doing well overall and has experienced a recent weight loss of about two pounds since last year, although he regained some weight during a vacation. He is currently on Mounjaro  for diabetes management, which he is tolerating well.   He has issues obtaining Mounjaro  from the pharmacy, noting it is often out of stock, causing him to take it a day or two late sometimes. He mentions that when he misses a dose, the medication seems more effective in controlling his appetite the following month. He is currently receiving a 28-day supply instead of a 90-day supply.  He is on losartan  and amlodipine  for hypertension. He recalls a previous discussion about reducing one of his  oral medications and prefers to stop amlodipine , as he associates it with numbness in his extremities. He has lost significant weight since starting these medications, from 220-230 pounds to a lower weight.  He reports experiencing reflux symptoms over the past three months, describing a sensation in his chest that is not quite burning or painful. He has not tried any medications for it yet. The sensation is worse in the evening, particularly after dinner. He previously experienced acid reflux when he started Mounjaro , but this current sensation feels different.  He mentions a recent illness in his household, where his son had human pneumometavirus, and he and his wife likely contracted it as well. His son is scheduled to see an ENT for recurrent ear infections, which have been occurring monthly since January.         Objective:  Physical Exam: BP 116/72 (BP Location: Left Arm, Patient Position: Sitting, Cuff Size: Normal)   Pulse 87   Temp 98.2 F (36.8 C)   Ht 6' (1.829 m)   Wt 190 lb (86.2 kg)   SpO2 97%   BMI 25.77 kg/m   Wt Readings from Last 3 Encounters:  01/05/24 190 lb (86.2 kg)  12/10/22 192 lb 6.4 oz (87.3 kg)  06/04/22 189 lb (85.7 kg)    Gen: No acute distress, resting comfortably CV: Regular rate and rhythm with no murmurs appreciated Pulm: Normal work of breathing, clear to auscultation bilaterally with no crackles, wheezes, or rhonchi Neuro: Grossly normal, moves  all extremities Psych: Normal affect and thought content      Naoko Diperna M. Kennyth, MD 01/05/2024 2:31 PM

## 2024-01-05 NOTE — Assessment & Plan Note (Signed)
 Check A1c with labs.  Doing well with Mounjaro  7.5 mg weekly.

## 2024-01-07 ENCOUNTER — Ambulatory Visit: Admitting: Family Medicine

## 2024-01-07 ENCOUNTER — Ambulatory Visit: Payer: Self-pay | Admitting: Family Medicine

## 2024-01-07 NOTE — Progress Notes (Signed)
 Great news!  Labs are all at goal.  Cholesterol is very well-controlled.  His A1c is very well-controlled at 5.6.  Do not need to make any adjustments to treatment plan at this time.  We can recheck in a year at his next physical.

## 2024-03-17 NOTE — Progress Notes (Signed)
 James Bautista                                          MRN: [id number]   03/17/2024   The [facility] Quality Team Specialist reviewed this patient medical record for the purposes of chart review for care gap closure. The following were reviewed: chart review for care gap closure-kidney health evaluation for diabetes:eGFR  and uACR.    [facility] Quality Team

## 2024-03-28 ENCOUNTER — Encounter: Payer: Self-pay | Admitting: Family Medicine

## 2024-03-28 ENCOUNTER — Ambulatory Visit: Admitting: Family Medicine

## 2024-03-28 VITALS — BP 130/80 | HR 108 | Temp 98.6°F | Ht 72.0 in | Wt 184.0 lb

## 2024-03-28 DIAGNOSIS — R197 Diarrhea, unspecified: Secondary | ICD-10-CM | POA: Diagnosis not present

## 2024-03-28 DIAGNOSIS — E119 Type 2 diabetes mellitus without complications: Secondary | ICD-10-CM

## 2024-03-28 DIAGNOSIS — Z7985 Long-term (current) use of injectable non-insulin antidiabetic drugs: Secondary | ICD-10-CM | POA: Diagnosis not present

## 2024-03-28 DIAGNOSIS — E1159 Type 2 diabetes mellitus with other circulatory complications: Secondary | ICD-10-CM | POA: Diagnosis not present

## 2024-03-28 DIAGNOSIS — I152 Hypertension secondary to endocrine disorders: Secondary | ICD-10-CM | POA: Diagnosis not present

## 2024-03-28 MED ORDER — ONDANSETRON HCL 8 MG PO TABS: 8.0000 mg | ORAL_TABLET | Freq: Three times a day (TID) | ORAL | 0 refills | Status: AC | PRN

## 2024-03-28 NOTE — Assessment & Plan Note (Signed)
 At goal today on losartan 100 mg daily.

## 2024-03-28 NOTE — Assessment & Plan Note (Signed)
 Last A1c very well-controlled at 5.6.  We can check urine albumin creatinine ratio next on that he comes in.  He is on Mounjaro  7.5 mg weekly-as above advised him to hold on this for couple of days until his acute gastroenteritis resolves.

## 2024-03-28 NOTE — Patient Instructions (Signed)
 It was very nice to see you today!  VISIT SUMMARY: You visited us  today due to persistent abdominal pain and gastrointestinal symptoms that have been ongoing for about a week. Your symptoms are slowly improving, and we discussed a plan to help manage your condition.  YOUR PLAN: ACUTE GASTROENTERITIS: You have symptoms of acute gastroenteritis, likely due to a viral infection. Your symptoms are slowly improving. -Take Zofran for nausea and diarrhea, 2-3 times daily. -Monitor your symptoms and report if there is no improvement by Thursday or Friday, or if you develop fever, chills, blood in stool, blood in vomit, or severe abdominal pain. -Delay taking Mounjaro  for a few days.  Return if symptoms worsen or fail to improve.   Take care, Dr Kennyth  PLEASE NOTE:  If you had any lab tests, please let us  know if you have not heard back within a few days. You may see your results on mychart before we have a chance to review them but we will give you a call once they are reviewed by us .   If we ordered any referrals today, please let us  know if you have not heard from their office within the next week.   If you had any urgent prescriptions sent in today, please check with the pharmacy within an hour of our visit to make sure the prescription was transmitted appropriately.   Please try these tips to maintain a healthy lifestyle:  Eat at least 3 REAL meals and 1-2 snacks per day.  Aim for no more than 5 hours between eating.  If you eat breakfast, please do so within one hour of getting up.   Each meal should contain half fruits/vegetables, one quarter protein, and one quarter carbs (no bigger than a computer mouse)  Cut down on sweet beverages. This includes juice, soda, and sweet tea.   Drink at least 1 glass of water with each meal and aim for at least 8 glasses per day  Exercise at least 150 minutes every week.

## 2024-03-28 NOTE — Progress Notes (Signed)
 James Bautista is a 43 y.o. male who presents today for an office visit.  Assessment/Plan:  New/Acute Problems: Diarrhea /vomiting No red flags.  Overall reassuring exam.  History is consistent with viral gastroenteritis.  Anticipate that symptoms will improve over the next several days though did discuss with patient it may take up to 1 to 2 weeks for complete resolution.  Will manage symptoms with Zofran 8 mg 3 times daily as needed.  Encouraged hydration.  Advised him to delay taking his Mounjaro  for a few days until his symptoms begin to improve.  We discussed reasons to return to care.  Follow-up as needed.  Chronic Problems Addressed Today: Type 2 diabetes mellitus (HCC) Last A1c very well-controlled at 5.6.  We can check urine albumin creatinine ratio next on that he comes in.  He is on Mounjaro  7.5 mg weekly-as above advised him to hold on this for couple of days until his acute gastroenteritis resolves.  Hypertension associated with diabetes (HCC) At goal today on losartan  100 mg daily.     Subjective:  HPI:  See assessment / plan for status of chronic conditions.   Discussed the use of AI scribe software for clinical note transcription with the patient, who gave verbal consent to proceed.  History of Present Illness James Bautista is a 43 year old male who presents with persistent abdominal pain and gastrointestinal symptoms.  He has been experiencing symptoms for about a week, beginning with vomiting on Tuesday evening and another episode later in the week. He describes waking up at night with 'knife searing pain' in his lower abdomen, rating the pain as a 7 to 9 out of 10, which once caused him to wake up screaming. The pain subsides by mid-morning, and he feels better throughout the day until bedtime.  He experiences diarrhea in the mornings, which is green in color, but no blood is present in the vomit or diarrhea. No fevers or chills are reported. He notes that his  symptoms are slowly improving but not at the usual pace he experiences with stomach bugs, which typically resolve in 24 to 48 hours.  He has been taking Pepto Bismol in the mornings, which seems to help, although he is unsure if the improvement is due to the medication or timing. His wife and toddler had similar symptoms recently, with his wife experiencing severe abdominal pain and vomiting, leading to an ER visit where she was diagnosed with a fatty liver and elevated white blood cell count. His toddler had runny diapers and a decreased appetite.  He is concerned about the duration of his symptoms and whether he needs stronger medication than over-the-counter options. He also mentions that his stomach is audibly gurgling at night, loud enough to wake his wife.  He is currently taking Mounjaro .         Objective:  Physical Exam: BP 130/80   Pulse (!) 108   Temp 98.6 F (37 C) (Temporal)   Ht 6' (1.829 m)   Wt 184 lb (83.5 kg)   SpO2 97%   BMI 24.95 kg/m   Gen: No acute distress, resting comfortably CV: Regular rate and rhythm with no murmurs appreciated Pulm: Normal work of breathing, clear to auscultation bilaterally with no crackles, wheezes, or rhonchi Abdomen: Bowel sounds present.  No deformities, soft, nontender, nondistended.  No rebound or guarding. Neuro: Grossly normal, moves all extremities Psych: Normal affect and thought content      [name], MD 03/28/2024  11:34 AM
